# Patient Record
Sex: Female | Born: 1955 | Race: Black or African American | Hispanic: No | Smoking: Former smoker
Health system: Southern US, Community
[De-identification: ages and names within clinical notes are randomized; demographics above are authoritative.]

## PROBLEM LIST (undated history)

## (undated) DIAGNOSIS — Z8719 Personal history of other diseases of the digestive system: Secondary | ICD-10-CM

## (undated) DIAGNOSIS — D509 Iron deficiency anemia, unspecified: Secondary | ICD-10-CM

## (undated) DIAGNOSIS — D649 Anemia, unspecified: Secondary | ICD-10-CM

## (undated) DIAGNOSIS — E876 Hypokalemia: Secondary | ICD-10-CM

## (undated) DIAGNOSIS — J45909 Unspecified asthma, uncomplicated: Secondary | ICD-10-CM

## (undated) DIAGNOSIS — K760 Fatty (change of) liver, not elsewhere classified: Secondary | ICD-10-CM

## (undated) DIAGNOSIS — R1013 Epigastric pain: Secondary | ICD-10-CM

## (undated) DIAGNOSIS — K860 Alcohol-induced chronic pancreatitis: Secondary | ICD-10-CM

## (undated) DIAGNOSIS — I10 Essential (primary) hypertension: Secondary | ICD-10-CM

## (undated) HISTORY — DX: Alcohol-induced chronic pancreatitis: K86.0

## (undated) HISTORY — DX: Personal history of other diseases of the digestive system: Z87.19

## (undated) HISTORY — DX: Fatty (change of) liver, not elsewhere classified: K76.0

## (undated) HISTORY — PX: LAPAROSCOPIC OOPHERECTOMY: SHX6507

## (undated) HISTORY — DX: Hypokalemia: E87.6

## (undated) HISTORY — DX: Iron deficiency anemia, unspecified: D50.9

## (undated) HISTORY — DX: Epigastric pain: R10.13

---

## 2004-10-09 ENCOUNTER — Emergency Department: Payer: Self-pay | Admitting: Unknown Physician Specialty

## 2004-10-15 ENCOUNTER — Emergency Department: Payer: Self-pay | Admitting: Internal Medicine

## 2004-10-22 ENCOUNTER — Emergency Department: Payer: Self-pay | Admitting: General Practice

## 2005-11-03 ENCOUNTER — Emergency Department: Payer: Self-pay | Admitting: Emergency Medicine

## 2007-10-03 ENCOUNTER — Emergency Department: Payer: Self-pay | Admitting: Unknown Physician Specialty

## 2007-11-09 ENCOUNTER — Emergency Department: Payer: Self-pay | Admitting: Internal Medicine

## 2007-11-09 ENCOUNTER — Other Ambulatory Visit: Payer: Self-pay

## 2008-02-16 ENCOUNTER — Emergency Department: Payer: Self-pay | Admitting: Emergency Medicine

## 2008-02-28 ENCOUNTER — Emergency Department: Payer: Self-pay | Admitting: Emergency Medicine

## 2008-02-29 ENCOUNTER — Other Ambulatory Visit: Payer: Self-pay

## 2010-08-12 ENCOUNTER — Emergency Department: Payer: Self-pay | Admitting: Emergency Medicine

## 2010-11-30 ENCOUNTER — Emergency Department: Payer: Self-pay | Admitting: Emergency Medicine

## 2010-12-08 ENCOUNTER — Emergency Department: Payer: Self-pay | Admitting: Emergency Medicine

## 2010-12-24 ENCOUNTER — Inpatient Hospital Stay: Payer: Self-pay | Admitting: Internal Medicine

## 2010-12-24 DIAGNOSIS — I059 Rheumatic mitral valve disease, unspecified: Secondary | ICD-10-CM

## 2010-12-25 DIAGNOSIS — R0602 Shortness of breath: Secondary | ICD-10-CM

## 2010-12-26 ENCOUNTER — Ambulatory Visit: Payer: Self-pay | Admitting: Family Medicine

## 2011-06-15 HISTORY — PX: COLONOSCOPY: SHX174

## 2011-07-01 ENCOUNTER — Ambulatory Visit: Payer: Self-pay | Admitting: Internal Medicine

## 2011-07-09 ENCOUNTER — Ambulatory Visit: Payer: Self-pay | Admitting: Gastroenterology

## 2011-11-04 IMAGING — CT CT ABD-PELV W/ CM
1 of 2 series · 15 of 32 positions shown, 19 images · non-contrast
Comparison: none

REASON FOR EXAM: [id number]  RLQ LLQ Abd Pain Constipation Nausea
COMMENTS:

[Series 2: abd 3mm w 3.0 i40f 3 · axial · 0.96mm/px · z∈[-533,-158]mm · 15 of 137 slices shown, 19 images]
[im 6/137  soft-tissue]
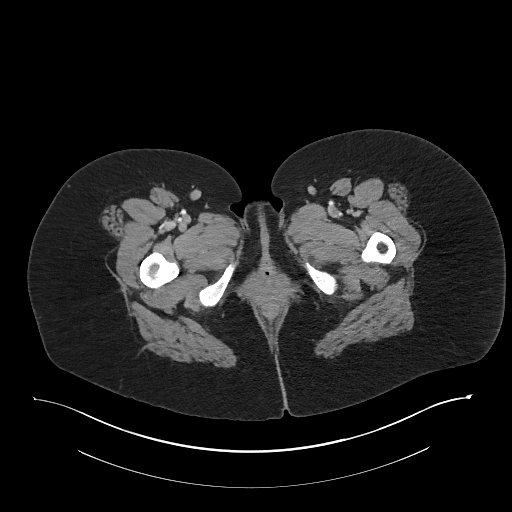
[im 6/137  bone]
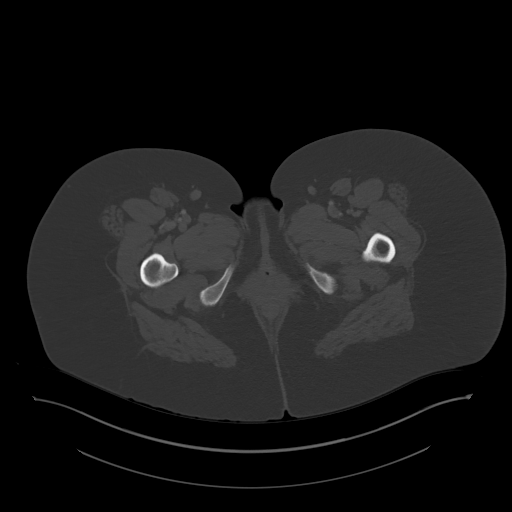
[im 16/137  soft-tissue]
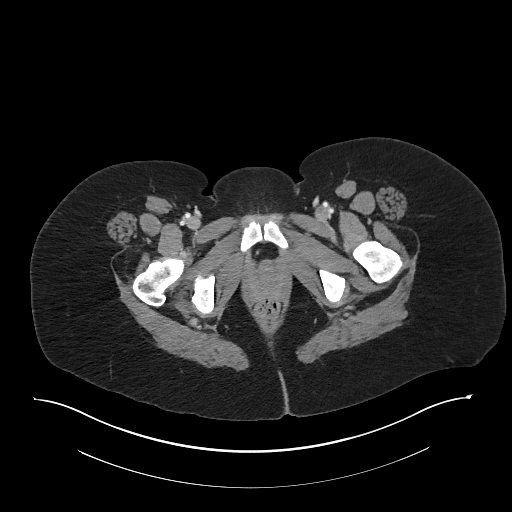
[im 27/137  soft-tissue]
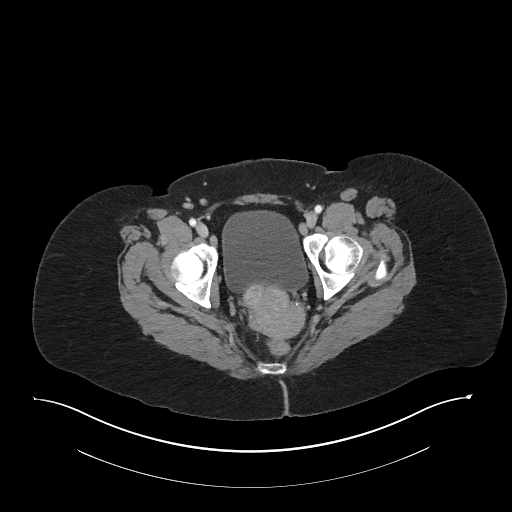
[im 37/137  soft-tissue]
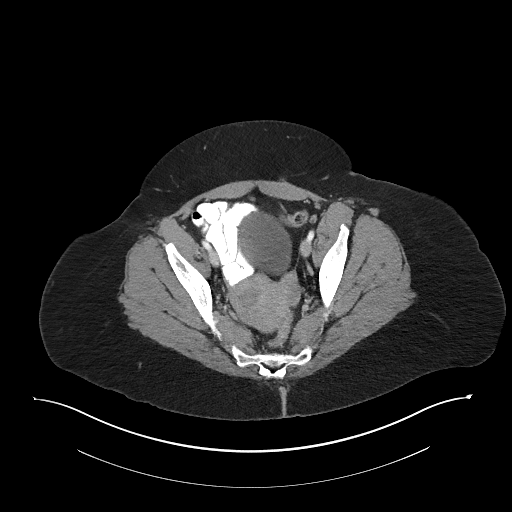
[im 48/137  soft-tissue]
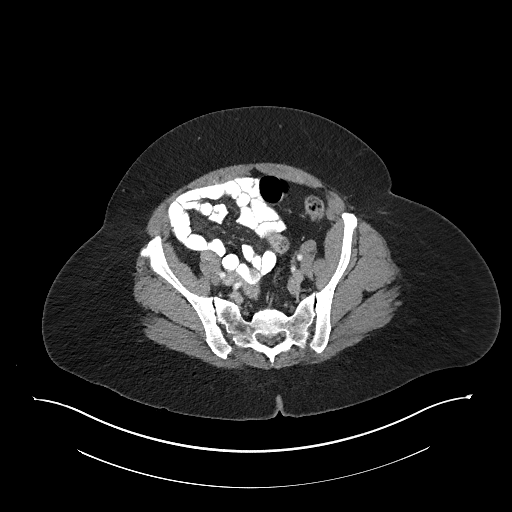
[im 58/137  soft-tissue]
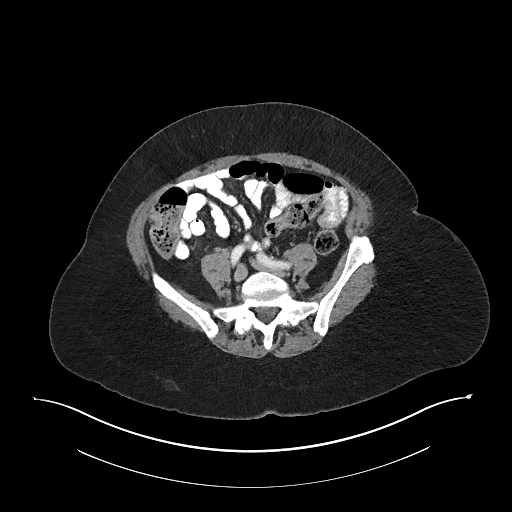
[im 69/137  soft-tissue]
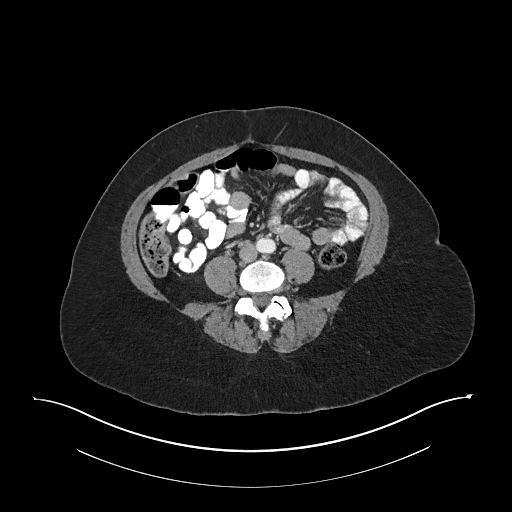
[im 79/137  soft-tissue]
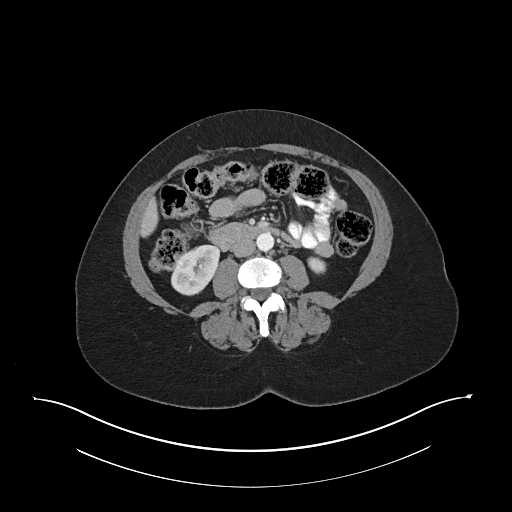
[im 89/137  soft-tissue]
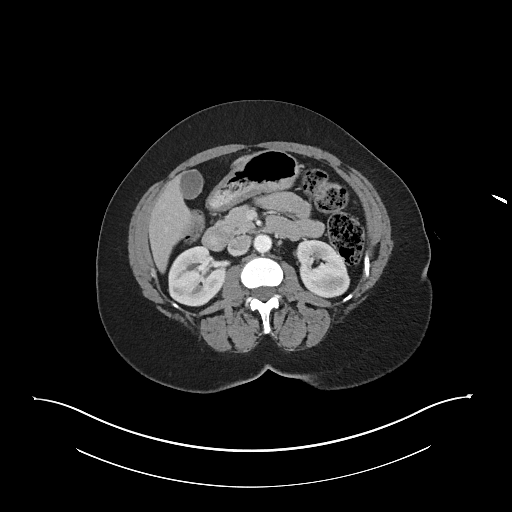
[im 89/137  bone]
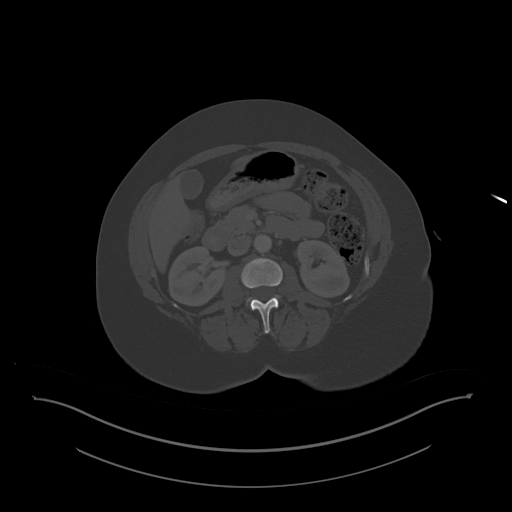
[im 100/137  soft-tissue]
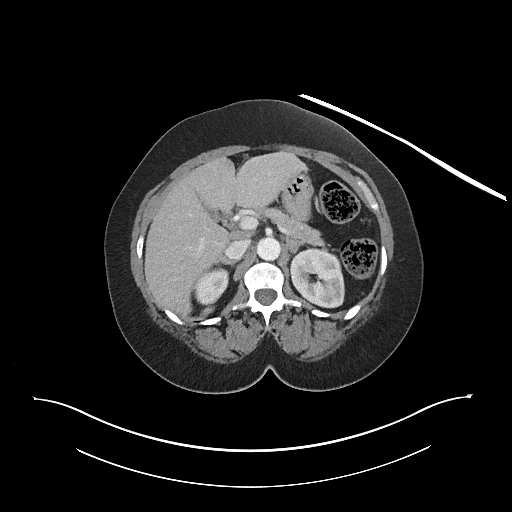
[im 110/137  soft-tissue]
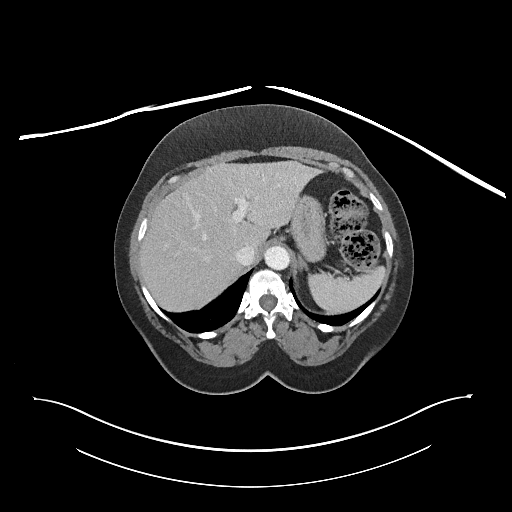
[im 116/137  lung]
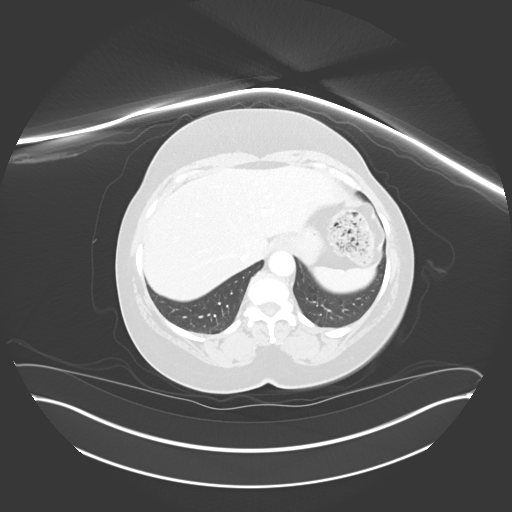
[im 121/137  soft-tissue]
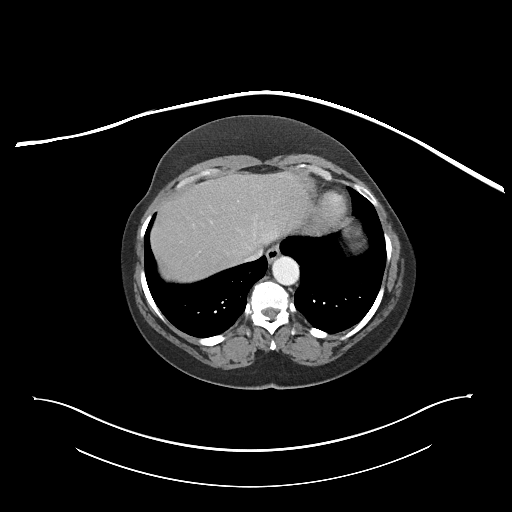
[im 121/137  lung]
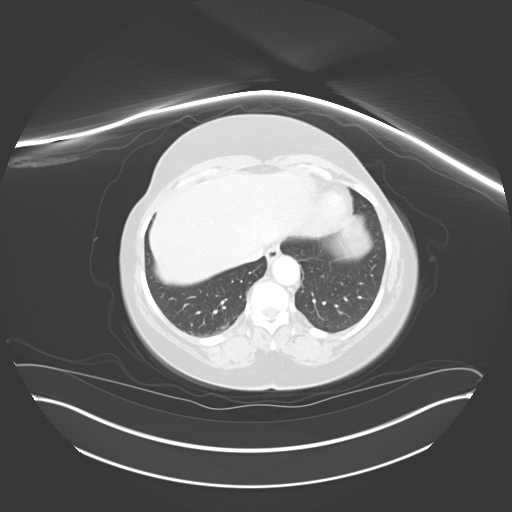
[im 126/137  lung]
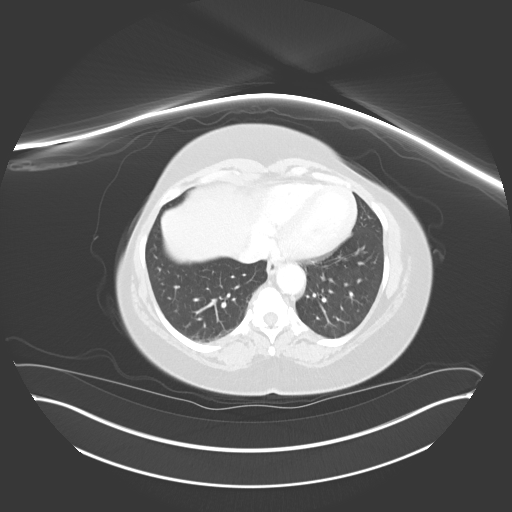
[im 131/137  soft-tissue]
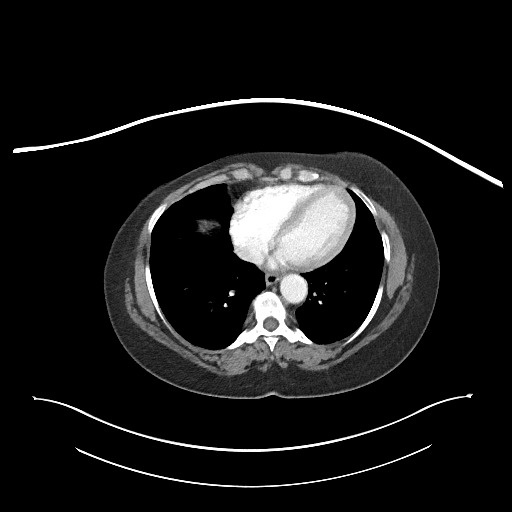
[im 131/137  lung]
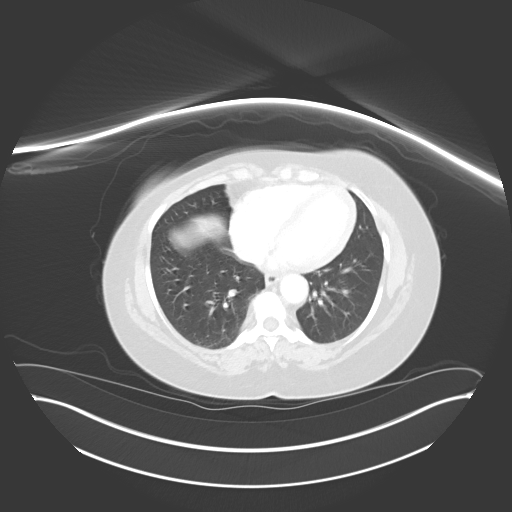

[15 of 32 positions shown; findings below may reference images not displayed]

PROCEDURE:     KCT - KCT ABDOMEN/PELVIS W  - July 01, 2011  [DATE]

RESULT:     CT of the abdomen and pelvis is performed with 100 mL of
Psovue-NEM iodinated intravenous contrast and oral contrast. Images are
reconstructed in the axial plane at 3.0 mm slice thickness. There is no
previous exam for comparison.

Images through the base the lungs demonstrate some minimal respiratory
motion artifact without infiltrate or mass.

The liver, spleen, pancreas, kidneys, aorta and adrenal glands appear
unremarkable. The gallbladder is present without evidence of stones. The
stomach is emptied of contrast and the contrast is passed through the
proximal small bowel but is not present to any degree in the colon at the
time of imaging. There is a moderate amount of fecal material scattered
through the colon raising the possibility of constipation. No abnormal bowel
distention is present to suggest obstruction. There is no evidence of
pneumoperitoneum. The abdominal wall appears intact. The appendix is normal.
There is no acute inflammatory process. The uterus shows a heterogeneous
pattern of enhancement which could raise the possibility of multiple
leiomyomatous changes. No calcification is present. The urinary bladder
appears unremarkable. There is no hydronephrosis or definite renal mass. No
calculi are evident.
IMPRESSION: 1. Possible mild constipation. Correlate clinically.
2. Heterogeneous appearance the uterus which raises the possibility of some
mild leiomyomatous disease. Correlate clinically. Ultrasounds available for
followup if desired based on clinical findings.

## 2012-01-16 ENCOUNTER — Ambulatory Visit: Payer: Self-pay | Admitting: Family Medicine

## 2012-05-15 ENCOUNTER — Inpatient Hospital Stay: Payer: Self-pay | Admitting: Internal Medicine

## 2012-05-15 LAB — CBC WITH DIFFERENTIAL/PLATELET
Basophil #: 0 10*3/uL (ref 0.0–0.1)
Basophil %: 0.4 %
Eosinophil %: 3 %
HCT: 37.7 % (ref 35.0–47.0)
HGB: 13.1 g/dL (ref 12.0–16.0)
Lymphocyte #: 0.6 10*3/uL — ABNORMAL LOW (ref 1.0–3.6)
Lymphocyte %: 8.5 %
MCH: 33.6 pg (ref 26.0–34.0)
Monocyte #: 0.2 x10 3/mm (ref 0.2–0.9)
Neutrophil #: 6.1 10*3/uL (ref 1.4–6.5)
Neutrophil %: 85.1 %
RBC: 3.89 10*6/uL (ref 3.80–5.20)

## 2012-05-15 LAB — BASIC METABOLIC PANEL
Anion Gap: 9 (ref 7–16)
BUN: 9 mg/dL (ref 7–18)
Chloride: 107 mmol/L (ref 98–107)
EGFR (African American): >60
EGFR (Non-African Amer.): >60
Glucose: 118 mg/dL — ABNORMAL HIGH (ref 65–99)
Osmolality: 281 (ref 275–301)
Potassium: 3.6 mmol/L (ref 3.5–5.1)
Sodium: 141 mmol/L (ref 136–145)

## 2012-06-17 ENCOUNTER — Observation Stay: Payer: Self-pay | Admitting: Specialist

## 2012-06-17 LAB — CBC
HGB: 11.3 g/dL — ABNORMAL LOW (ref 12.0–16.0)
MCH: 31.4 pg (ref 26.0–34.0)
MCV: 95 fL (ref 80–100)
Platelet: 404 10*3/uL (ref 150–440)
RBC: 3.59 10*6/uL — ABNORMAL LOW (ref 3.80–5.20)
RDW: 14.6 % — ABNORMAL HIGH (ref 11.5–14.5)
WBC: 6 10*3/uL (ref 3.6–11.0)

## 2012-06-17 LAB — BASIC METABOLIC PANEL
Calcium, Total: 9.8 mg/dL (ref 8.5–10.1)
EGFR (African American): >60
EGFR (Non-African Amer.): >60
Glucose: 111 mg/dL — ABNORMAL HIGH (ref 65–99)
Potassium: 4.1 mmol/L (ref 3.5–5.1)

## 2013-01-29 ENCOUNTER — Ambulatory Visit: Payer: Self-pay | Admitting: Family Medicine

## 2014-07-26 ENCOUNTER — Ambulatory Visit: Payer: Self-pay | Admitting: Family Medicine

## 2015-01-31 NOTE — Discharge Summary (Signed)
PATIENT NAME:  Linda Mcgee, STRAKA MR#:  [id number] DATE OF BIRTH:  1956/05/16  DATE OF ADMISSION:  05/15/2012 DATE OF DISCHARGE:  05/17/2012   PRIMARY CARE PHYSICIAN: [name], MD  DISCHARGE DIAGNOSES:  1. Acute respiratory failure. 2. Asthma exacerbation.  3. Hypertension.  CODE STATUS: FULL CODE.   HOME MEDICATIONS:  1. Advair 250 mcg/50 mcg inhaler, one puff twice a day.  2. Combivent 103 mcg/18 mcg inhalation two puffs every 4 hours p.r.n. wheezing.  3. Ventolin HFA one spray p.r.n.  CONTINUE HOME MEDICATIONS: 1. HCTZ 12.5 mg/10 mg p.o. daily.  2. Prednisone 40 mg p.o. daily, then taper.   ACTIVITY: As tolerated.   DIET: Low sodium diet.  DISCHARGE FOLLOWUP: Followup with PCP within 1 to 2 weeks.   REASON FOR ADMISSION: Respiratory distress.   HOSPITAL COURSE: The patient is a 59 year old African American female with a history of asthma and hypertension who presented to the ED with shortness of breath with just minimal activity, and wheezing. She came to the ED and was found to be in respiratory distress and was given multiple nebulizers and high dose of IV steroid with only mild improvement. She was able to barely speak in complete sentences and was admitted for respiratory distress and asthma exacerbation. For detailed history and physical examination, please refer to the admission note dictated by Dr. Edwina Barth. After admission the patient has been treated with O2 by nasal cannula, Solu-Medrol IV, frequent nebulizers, and Levaquin. After the above-mentioned treatment, the patient's symptoms have much improved. She only has mild wheezing, no shortness of breath, cough, or sputum, and vital signs are stable. On physical examination, she has only wheezing in bilateral lungs. The patient is clinically stable and will be discharged to home today.   I discussed the patient's discharge plan with the patient.   TIME SPENT: About 32 minutes.  ____________________________ Demetrios Loll, MD qc:slb D: 05/17/2012 14:16:41 ET T: 05/18/2012 10:30:28 ET JOB#: [id number]  cc: Demetrios Loll, MD, <Dictator> Irven Easterly. Kary Kos, MD Demetrios Loll MD ELECTRONICALLY SIGNED 05/19/2012 15:12

## 2015-01-31 NOTE — H&P (Signed)
PATIENT NAME:  Linda Mcgee, Linda Mcgee MR#:  [id number] DATE OF BIRTH:  [birth year]  DATE OF ADMISSION:  05/15/2012  PRIMARY CARE PHYSICIAN: [name], MD   PULMONOLOGIST: Wallene Huh, MD   CHIEF COMPLAINT: Respiratory distress.   HISTORY OF PRESENT ILLNESS: This is a 59 year old female who has a history of asthma. She said about four weeks ago she was having exacerbation, was seen as an outpatient, given a steroid taper, felt a Silverio better, then worse when she was off of steroids. She went to Urgent Care and was given a Z-Pak. Since completing that she didn't feel any better. She's been having cough that's fairly nonproductive and wheezing a lot, getting short of breath with just minimal activity. Today on her way to work she just said she could not breathe, came to the hospital and found to be in respiratory distress, given multiple nebulizers and high dose IV steroids with only mild improvement. She is able to barely speak in complete sentences but if she tries to ambulate more than a few steps she is severely short of breath and she is still wheezing so we're going to go ahead and admit her for further aggressive treatment.   PAST MEDICAL HISTORY:  1. Asthma.  2. Hypertension.   ALLERGIES: No known drug allergies.   CURRENT MEDICATIONS:  1. Advair 250/50 1 puff b.i.d.  2. Ventolin 1 puff q.4 hours p.r.n.  3. Albuterol nebs p.r.n.  4. Lisinopril/hydrochlorothiazide 10/12.5 mg daily.   SOCIAL HISTORY: Does not smoke. Does not drink any alcohol.   FAMILY HISTORY: Significant for hypertension.   REVIEW OF SYSTEMS: CONSTITUTIONAL: No fever. EYES: No blurred vision. ENT: No hearing loss. CARDIOVASCULAR: No chest pain. PULMONARY: She has shortness of breath and wheezing. GI: No nausea, vomiting, or diarrhea. GU: No dysuria. ENDOCRINE: No heat or cold intolerance. INTEGUMENTARY: No rash. MUSCULOSKELETAL: Occasional joint pain. NEUROLOGIC: No numbness or weakness.   PHYSICAL EXAMINATION:    VITAL SIGNS: Temperature 97.3, pulse 80, respirations 20, blood pressure 193/105.   GENERAL: This is a well nourished black female in mild respiratory distress.   HEENT: The pupils are equal, round, and reactive to light. Sclerae nonicteric. Oral mucosa is moist. Oropharynx is clear. Nasopharynx is clear.   NECK: Supple. No JVD, lymphadenopathy, or thyromegaly.   CARDIOVASCULAR: Regular rate and rhythm. No murmurs.   LUNGS: There is expiratory and inspiratory wheezing. No dullness to percussion. She is mildly using accessory muscles.   ABDOMEN: Soft, nontender, nondistended. Bowel sounds are positive. No hepatosplenomegaly. No masses.   EXTREMITIES: There is no edema. No joint deformity.   NEUROLOGIC: Cranial nerves II through XII are intact. She is alert and oriented x4.   SKIN: Moist with no rash.   RADIOLOGICAL DATA: Chest x-ray shows no infiltrates.   ASSESSMENT AND PLAN:  1. Respiratory distress. She has been in mild to moderate respiratory distress with her asthma exacerbation. Will give her some low flow oxygen and treat aggressively the underlying cause. Monitor for any deterioration.  2. Asthma exacerbation. I am going to give her high dose IV steroids and frequent nebulizers and monitor.  3. Hypertension. Will continue her lisinopril/hydrochlorothiazide. It is probably exacerbated now because of her respiratory distress. Will adjust the medications as necessary.   TIME SPENT ON ADMISSION: 45 minutes.   ____________________________ Baxter Hire, MD jdj:drc D: 05/15/2012 14:27:46 ET T: 05/15/2012 14:34:40 ET JOB#: [id number]  cc: Baxter Hire, MD, <Dictator> Irven Easterly. Kary Kos, MD Herbon E. Raul Del, MD Baxter Hire  MD ELECTRONICALLY SIGNED 05/15/2012 21:09

## 2015-01-31 NOTE — Discharge Summary (Signed)
PATIENT NAME:  Linda Mcgee, STRANG MR#:  [id number] DATE OF BIRTH:  11/01/1955  DATE OF ADMISSION:  06/17/2012 DATE OF DISCHARGE:  06/18/2012  For a detailed note, please take a look at the history and physical done on admission.   DIAGNOSES AT DISCHARGE:  1. Acute asthma exacerbation.  2. History of hypertension.   DIET: The patient is being discharged on a low sodium diet.   ACTIVITY: As tolerated.   FOLLOW-UP: Follow-up in the next 1 to 2 weeks with Dr. Raul Del and also Dr. Kary Kos.   DISCHARGE MEDICATIONS:  1. Hydrochlorothiazide/lisinopril 12.5/10 one tab daily.  2. Advair 250/50 1 puff b.i.d.  3. Combivent 2 puffs q.4 hours as needed.  4. Albuterol inhaler 2 puffs b.i.d. as needed.  5. Zyrtec 10 mg b.i.d. as needed.  6. Albuterol nebulizers q.i.d. as needed.  7. Singular 10 mg daily.  8. Prednisone taper starting at 60 mg, down to 10 mg over the next 12 days.   BRIEF HOSPITAL COURSE: This is a 59 year old female with medical problems as mentioned above who presented to the hospital secondary to shortness of breath and respiratory failure from asthma exacerbation.  1. Asthma exacerbation. The likely cause of the patient's asthma exacerbation is allergic rhinitis. The patient is supposed to be seeing an allergist as an outpatient coming up fairly soon. The patient follows up with Dr. Raul Del who had referred her to an allergist. In the hospital the patient was started on IV steroids and around-the-clock nebulizer treatments. I did add some Singulair on top of her maintenance therapy. She, therefore, is being presently discharged on a long prednisone taper along with her Advair, Combivent, and albuterol rescue inhaler with nebulizer treatments along with Singulair. She will have close follow-up with Dr. Raul Del as an outpatient and will see the allergist once she finishes her prednisone taper as stated. Clinically she is hemodynamically stable and her shortness of breath has improved.   2. Hypertension. She remained hemodynamically stable on her Zestoretic and she will resume that upon discharge.   CODE STATUS: The patient is a FULL CODE.   TIME SPENT: 35 minutes.   ____________________________ Belia Heman. Verdell Carmine, MD vjs:drc D: 06/18/2012 17:01:19 ET T: 06/19/2012 14:02:24 ET JOB#: [id number]  cc: Belia Heman. Verdell Carmine, MD, <Dictator> Herbon E. Raul Del, MD Irven Easterly. Kary Kos, MD Henreitta Leber MD ELECTRONICALLY SIGNED 06/22/2012 13:24

## 2015-01-31 NOTE — H&P (Signed)
PATIENT NAME:  Linda Mcgee, Linda Mcgee MR#:  [id number] DATE OF BIRTH:  12-Jul-1956  DATE OF ADMISSION:  06/17/2012  PRIMARY CARE PHYSICIAN: [name], MD   CHIEF COMPLAINT: Shortness of breath.   HISTORY OF PRESENT ILLNESS: This is a 59 year old female who presents to the Emergency Room due to progressive shortness of breath over the past couple of days. The patient says that she has a history of asthma, was here in the hospital about 4 to 5 weeks ago with asthma exacerbation, and had been doing well since she went home until the past couple of days her shortness of breath has been getting worse on exertion. This morning it was significantly worse where even while trying to change her clothes or going to the bathroom she was significantly short of breath and, therefore, came to the ER for further evaluation. In the Emergency Room, the patient received three nebulizer treatments along with IV steroids with Ariel improvement in her symptoms. Hospitalist service was contacted for further treatment and evaluation. The patient does admit to a cough productive of clear sputum, some postnasal drip but no nausea, no vomiting, no chest pain, and no other associated symptoms presently.   REVIEW OF SYSTEMS: CONSTITUTIONAL: No documented fever. No weight gain, no weight loss. EYES: No blurry or double vision. ENT: No tinnitus. Positive postnasal drip. No redness of the oropharynx. RESPIRATORY: Positive cough. Positive wheeze. No hemoptysis. Positive dyspnea on exertion. CARDIOVASCULAR: No chest pain, no orthopnea, no palpitations, no syncope. GI: No nausea, no vomiting, no diarrhea, no abdominal pain, no melena, no hematochezia. GU: No dysuria, no hematuria. ENDOCRINE: No polyuria or nocturia. No heat or cold intolerance. HEME: No anemia, no bruising, no bleeding. INTEGUMENTARY: No rashes, no lesions. MUSCULOSKELETAL: No arthritis, no swelling, no gout. NEUROLOGIC: No numbness, no tingling, no ataxia, no seizure-type  activity. PSYCH: No anxiety, no insomnia, no ADD.   PAST MEDICAL HISTORY:  1. Asthma. 2. Hypertension.   ALLERGIES: No known drug allergies.   SOCIAL HISTORY: No smoking. Occasional alcohol use. No illicit drug abuse. Lives at home by herself.   FAMILY HISTORY: Significant for asthma and hypertension.   CURRENT MEDICATIONS:  1. Advair 250/50 1 puff daily.  2. Albuterol nebulizer q.4 hours.  3. Combivent 2 puffs q.4 hours as needed.  4. Hydrochlorothiazide/lisinopril 12.5/10 one tab daily.  5. Albuterol inhaler 2 puffs b.i.d. as needed.  6. Zyrtec 10 mg b.i.d. as needed.   PHYSICAL EXAMINATION ON ADMISSION:   VITAL SIGNS: Temperature 98, pulse 81, respirations 20, blood pressure 173/96, sats 98% on 2 liters nasal cannula.    GENERAL: She is a pleasant appearing female in mild respiratory distress.   HEENT: Atraumatic, normocephalic. Extraocular muscles are intact. Pupils equal and reactive to light. Sclerae anicteric. No conjunctival injection. No pharyngeal erythema.   NECK: Supple. No jugular venous distention. No bruits, no lymphadenopathy, no thyromegaly.   HEART: Tachycardic, regular. No murmurs, no rubs, no clicks.   LUNGS: She has some coarse rhonchi and wheezing diffusely. Negative use of accessory muscles. No dullness to percussion.   ABDOMEN: Soft, flat, nontender, nondistended. Has good bowel sounds. No hepatosplenomegaly appreciated.   EXTREMITIES: No evidence of any cyanosis, clubbing, or peripheral edema. Has +2 pedal and radial pulses bilaterally.   NEUROLOGICAL: The patient is alert, awake, and oriented x3 with no focal motor or sensory deficits appreciated bilaterally.   SKIN: Moist and warm with no rash appreciated.   LYMPHATIC: There is no cervical or axillary lymphadenopathy.   LABORATORY,  DIAGNOSTIC, AND RADIOLOGICAL DATA: Serum glucose 111, BUN 16, creatinine 0.8, sodium 143, potassium 4.1, chloride 112, bicarb 25. The patient's white cell count is 6,  hemoglobin 11.3, hematocrit 34, platelet count 404.   The patient did have a chest x-ray done which showed no evidence of acute cardiopulmonary disease. Mild hyperinflation consistent with reactive airway disease.   ASSESSMENT AND PLAN: This is a 59 year old female with a history of asthma and hypertension who presents to the hospital with shortness of breath, wheezing, and likely an asthma exacerbation.  1. Asthma exacerbation. The patient probably has mild to moderate intermittent asthma. The current exacerbation is possibly related to bronchitis/allergic rhinitis. Her chest x-ray does not show any evidence of acute pneumonia. She is afebrile. She has normal white cell count. I will start treatment with IV steroids, around-the-clock nebulizer treatments. Continue her Advair. I will also add some Singulair for an allergic component to see if it helps improve her symptoms and improve relapses. The patient has been followed by Dr. Raul Del from Pulmonary. If needed, will consult him and follow her clinically.  2. Hypertension, presently hemodynamically stable. Continue Zestoretic as stated.   CODE STATUS: The patient is a FULL CODE.   TIME SPENT: 45 minutes.   ____________________________ Belia Heman. Verdell Carmine, MD vjs:drc D: 06/17/2012 10:54:41 ET T: 06/17/2012 11:16:16 ET JOB#: [id number]  cc: Belia Heman. Verdell Carmine, MD, <Dictator> Irven Easterly. Kary Kos, MD Henreitta Leber MD ELECTRONICALLY SIGNED 06/17/2012 15:57

## 2020-05-10 ENCOUNTER — Emergency Department
Admission: EM | Admit: 2020-05-10 | Discharge: 2020-05-11 | Disposition: A | Discharge status: Home / Self Care | Payer: Self-pay | Attending: Emergency Medicine | Admitting: Emergency Medicine

## 2020-05-10 ENCOUNTER — Other Ambulatory Visit: Payer: Self-pay

## 2020-05-10 ENCOUNTER — Encounter: Payer: Self-pay | Admitting: Emergency Medicine

## 2020-05-10 DIAGNOSIS — K922 Gastrointestinal hemorrhage, unspecified: Secondary | ICD-10-CM | POA: Insufficient documentation

## 2020-05-10 DIAGNOSIS — D649 Anemia, unspecified: Secondary | ICD-10-CM | POA: Insufficient documentation

## 2020-05-10 DIAGNOSIS — F17200 Nicotine dependence, unspecified, uncomplicated: Secondary | ICD-10-CM | POA: Insufficient documentation

## 2020-05-10 DIAGNOSIS — I10 Essential (primary) hypertension: Secondary | ICD-10-CM | POA: Insufficient documentation

## 2020-05-10 DIAGNOSIS — J45909 Unspecified asthma, uncomplicated: Secondary | ICD-10-CM | POA: Insufficient documentation

## 2020-05-10 HISTORY — DX: Unspecified asthma, uncomplicated: J45.909

## 2020-05-10 HISTORY — DX: Essential (primary) hypertension: I10

## 2020-05-10 HISTORY — DX: Anemia, unspecified: D64.9

## 2020-05-10 LAB — CBC
HCT: 24.9 % — ABNORMAL LOW (ref 36.0–46.0)
Hemoglobin: 7.4 g/dL — ABNORMAL LOW (ref 12.0–15.0)
MCH: 26.8 pg (ref 26.0–34.0)
MCHC: 29.7 g/dL — ABNORMAL LOW (ref 30.0–36.0)
MCV: 90.2 fL (ref 80.0–100.0)
Platelets: 476 K/uL — ABNORMAL HIGH (ref 150–400)
RBC: 2.76 MIL/uL — ABNORMAL LOW (ref 3.87–5.11)
RDW: 23.3 % — ABNORMAL HIGH (ref 11.5–15.5)
WBC: 9.7 K/uL (ref 4.0–10.5)
nRBC: 0.2 % (ref 0.0–0.2)

## 2020-05-10 LAB — COMPREHENSIVE METABOLIC PANEL WITH GFR
ALT: 39 U/L (ref 0–44)
AST: 33 U/L (ref 15–41)
Albumin: 3.7 g/dL (ref 3.5–5.0)
Alkaline Phosphatase: 54 U/L (ref 38–126)
Anion gap: 12 (ref 5–15)
BUN: 13 mg/dL (ref 8–23)
CO2: 26 mmol/L (ref 22–32)
Calcium: 10.4 mg/dL — ABNORMAL HIGH (ref 8.9–10.3)
Chloride: 100 mmol/L (ref 98–111)
Creatinine, Ser: 1.01 mg/dL — ABNORMAL HIGH (ref 0.44–1.00)
GFR calc Af Amer: >60 mL/min
GFR calc non Af Amer: 59 mL/min — ABNORMAL LOW
Glucose, Bld: 100 mg/dL — ABNORMAL HIGH (ref 70–99)
Potassium: 2.8 mmol/L — ABNORMAL LOW (ref 3.5–5.1)
Sodium: 138 mmol/L (ref 135–145)
Total Bilirubin: 0.9 mg/dL (ref 0.3–1.2)
Total Protein: 7.1 g/dL (ref 6.5–8.1)

## 2020-05-10 NOTE — ED Triage Notes (Signed)
Seen by PCP and had blood work drawn today. States HGB 7.5.  Referred to ED for transfusion.

## 2020-05-11 LAB — CBC WITH DIFFERENTIAL/PLATELET
Abs Immature Granulocytes: 0.04 10*3/uL (ref 0.00–0.07)
Basophils Absolute: 0 10*3/uL (ref 0.0–0.1)
Basophils Relative: 0 %
Eosinophils Absolute: 0.7 10*3/uL — ABNORMAL HIGH (ref 0.0–0.5)
Eosinophils Relative: 7 %
HCT: 25.9 % — ABNORMAL LOW (ref 36.0–46.0)
Hemoglobin: 7.6 g/dL — ABNORMAL LOW (ref 12.0–15.0)
Immature Granulocytes: 0 %
Lymphocytes Relative: 9 %
Lymphs Abs: 0.9 10*3/uL (ref 0.7–4.0)
MCH: 26.9 pg (ref 26.0–34.0)
MCHC: 29.3 g/dL — ABNORMAL LOW (ref 30.0–36.0)
MCV: 91.5 fL (ref 80.0–100.0)
Monocytes Absolute: 1.2 10*3/uL — ABNORMAL HIGH (ref 0.1–1.0)
Monocytes Relative: 12 %
Neutro Abs: 7.4 10*3/uL (ref 1.7–7.7)
Neutrophils Relative %: 72 %
Platelets: 503 10*3/uL — ABNORMAL HIGH (ref 150–400)
RBC: 2.83 MIL/uL — ABNORMAL LOW (ref 3.87–5.11)
RDW: 23.5 % — ABNORMAL HIGH (ref 11.5–15.5)
WBC: 10.2 10*3/uL (ref 4.0–10.5)
nRBC: 0.2 % (ref 0.0–0.2)

## 2020-05-11 LAB — CBC
HCT: 25.3 % — ABNORMAL LOW (ref 36.0–46.0)
Hemoglobin: 7.4 g/dL — ABNORMAL LOW (ref 12.0–15.0)
MCH: 27.2 pg (ref 26.0–34.0)
MCHC: 29.2 g/dL — ABNORMAL LOW (ref 30.0–36.0)
MCV: 93 fL (ref 80.0–100.0)
Platelets: 502 10*3/uL — ABNORMAL HIGH (ref 150–400)
RBC: 2.72 MIL/uL — ABNORMAL LOW (ref 3.87–5.11)
RDW: 23.8 % — ABNORMAL HIGH (ref 11.5–15.5)
WBC: 9.9 10*3/uL (ref 4.0–10.5)
nRBC: 0.2 % (ref 0.0–0.2)

## 2020-05-11 LAB — IRON AND TIBC
Iron: 14 ug/dL — ABNORMAL LOW (ref 28–170)
Saturation Ratios: 4 % — ABNORMAL LOW (ref 10.4–31.8)
TIBC: 382 ug/dL (ref 250–450)
UIBC: 368 ug/dL

## 2020-05-11 LAB — RETICULOCYTES
Immature Retic Fract: 32.4 % — ABNORMAL HIGH (ref 2.3–15.9)
Immature Retic Fract: 33.3 % — ABNORMAL HIGH (ref 2.3–15.9)
RBC.: 2.72 MIL/uL — ABNORMAL LOW (ref 3.87–5.11)
RBC.: 2.83 MIL/uL — ABNORMAL LOW (ref 3.87–5.11)
Retic Count, Absolute: 76.1 10*3/uL (ref 19.0–186.0)
Retic Count, Absolute: 77 10*3/uL (ref 19.0–186.0)
Retic Ct Pct: 2.7 % (ref 0.4–3.1)
Retic Ct Pct: 2.8 % (ref 0.4–3.1)

## 2020-05-11 LAB — FOLATE: Folate: 4.8 ng/mL — ABNORMAL LOW (ref >5.9)

## 2020-05-11 LAB — FERRITIN: Ferritin: 23 ng/mL (ref 11–307)

## 2020-05-11 LAB — VITAMIN B12: Vitamin B-12: 5108 pg/mL — ABNORMAL HIGH (ref 180–914)

## 2020-05-11 LAB — HEMOGLOBIN AND HEMATOCRIT, BLOOD
HCT: 28 % — ABNORMAL LOW (ref 36.0–46.0)
Hemoglobin: 8.7 g/dL — ABNORMAL LOW (ref 12.0–15.0)

## 2020-05-11 LAB — PREPARE RBC (CROSSMATCH): Order Confirmation: POSITIVE

## 2020-05-11 MED ORDER — SODIUM CHLORIDE 0.9 % IV SOLN: 10.0000 mL/h | Freq: Once | INTRAVENOUS | Status: DC

## 2020-05-11 NOTE — ED Provider Notes (Signed)
[facility] Emergency Department Provider Note  ____________________________________________   First MD Initiated Contact with Patient 05/10/20 2344     (approximate)  I have reviewed the triage vital signs and the nursing notes.   HISTORY  Chief Complaint No chief complaint on file.    HPI Linda Mcgee is a 64 y.o. female with medical history as listed below who presents at the recommendation of her primary care provider for further evaluation of low hemoglobin.  The patient reports that for a couple of months she has had a very gradual and slow increase in fatigue, generalized weakness, and some dyspnea on exertion.  She said that she is to the point that walking up the stairs makes her tired and feel like she needs to rest even though it is not necessarily an inability to catch her breath.  She just feels very tired.  She has not noticed any blood in her stool or dark-colored stool.  She has had some aching abdominal pain in the upper part of her abdomen that is often worse after she eats.  She still has a good appetite and has had no nausea nor vomiting.  Over the last couple of days she has had some lower abdominal aching as well but it is currently absent.  She has no blood in her urine and no dysuria or increased urinary frequency.  She had similar symptoms about 3 years ago when she lived in [place]. She said she had a work-up at that time and had no evidence of GI bleeding and no particular reason for her anemia was found.  She got better after transfusion.  She went to her primary care provider yesterday and they did some basic lab work.  She was called this evening and told to come to the emergency department for hemoglobin of 7.5.  She describes her symptoms as moderate in severity, very gradual in onset, nothing in particular makes it better and exertion seems to make her fatigue worse.  Eating seems to make the upper abdominal discomfort  worse.  Of note, she takes quite a bit of NSAIDs (Aleve) and she drinks an estimated 3 bottles of wine over each weekend (a bottle each on Friday, Saturday, and Sunday night).  She says that she does not drink any during the week.        Past Medical History:  Diagnosis Date  . Anemia   . Asthma   . Hypertension     There are no problems to display for this patient.   Past Surgical History:  Procedure Laterality Date  . LAPAROSCOPIC OOPHERECTOMY Left     Prior to Admission medications   Not on File    Allergies Patient has no known allergies.  No family history on file.  Social History Social History   Tobacco Use  . Smoking status: Current Some Day Smoker  . Smokeless tobacco: Never Used  Substance Use Topics  . Alcohol use: Not on file  . Drug use: Not on file    Review of Systems Constitutional: No fever/chills.  Fatigue. Eyes: No visual changes. ENT: No sore throat. Cardiovascular: Denies chest pain. Respiratory: Denies shortness of breath except sometimes with exertion. Gastrointestinal: Upper abdominal aching pain, usually worse after eating.  Denies nausea, vomiting, diarrhea.  Denies black and tarry stool. Genitourinary: Negative for dysuria. Musculoskeletal: Negative for neck pain.  Negative for back pain. Integumentary: Negative for rash. Neurological: Negative for headaches, focal weakness or numbness.   ____________________________________________  PHYSICAL EXAM:  VITAL SIGNS: ED Triage Vitals  Enc Vitals Group     BP 05/10/20 1627 (!) 145/77     Pulse Rate 05/10/20 1627 82     Resp 05/10/20 1627 16     Temp 05/10/20 1627 98.1 F (36.7 C)     Temp Source 05/10/20 1627 Oral     SpO2 05/10/20 1627 100 %     Weight 05/10/20 1628 68.9 kg (152 lb)     Height 05/10/20 1628 1.524 m (5')     Head Circumference --      Peak Flow --      Pain Score 05/10/20 1628 0     Pain Loc --      Pain Edu? --      Excl. in [place]? --      Constitutional: Alert and oriented.  Generally well-appearing and in no distress. Eyes: Conjunctivae are normal.  Head: Atraumatic. Nose: No congestion/rhinnorhea. Mouth/Throat: Patient is wearing a mask. Neck: No stridor.  No meningeal signs.   Cardiovascular: Normal rate, regular rhythm. Good peripheral circulation. Grossly normal heart sounds. Respiratory: Normal respiratory effort.  No retractions. Gastrointestinal: Soft and nontender in any quadrant at this time including no epigastric tenderness and no right upper quadrant tenderness.  Negative Murphy sign.  No lower abdominal tenderness, no rebound, no guarding. Rectal: Normal external rectal exam with no evidence of external hemorrhoids.  Nontender digital exam.  Brown stool, no gross blood but Hemoccult positive.  ED chaperone present throughout exam. Musculoskeletal: No lower extremity tenderness nor edema. No gross deformities of extremities. Neurologic:  Normal speech and language. No gross focal neurologic deficits are appreciated.  Skin:  Skin is warm, dry and intact. Psychiatric: Mood and affect are normal. Speech and behavior are normal.  ____________________________________________   LABS (all labs ordered are listed, but only abnormal results are displayed)  Labs Reviewed  COMPREHENSIVE METABOLIC PANEL - Abnormal; Notable for the following components:      Result Value   Potassium 2.8 (*)    Glucose, Bld 100 (*)    Creatinine, Ser 1.01 (*)    Calcium 10.4 (*)    GFR calc non Af Amer 59 (*)    All other components within normal limits  CBC - Abnormal; Notable for the following components:   RBC 2.76 (*)    Hemoglobin 7.4 (*)    HCT 24.9 (*)    MCHC 29.7 (*)    RDW 23.3 (*)    Platelets 476 (*)    All other components within normal limits  FOLATE - Abnormal; Notable for the following components:   Folate 4.8 (*)    All other components within normal limits  IRON AND TIBC - Abnormal; Notable for the  following components:   Iron 14 (*)    Saturation Ratios 4 (*)    All other components within normal limits  CBC - Abnormal; Notable for the following components:   RBC 2.72 (*)    Hemoglobin 7.4 (*)    HCT 25.3 (*)    MCHC 29.2 (*)    RDW 23.8 (*)    Platelets 502 (*)    All other components within normal limits  RETICULOCYTES - Abnormal; Notable for the following components:   RBC. 2.72 (*)    Immature Retic Fract 33.3 (*)    All other components within normal limits  CBC WITH DIFFERENTIAL/PLATELET - Abnormal; Notable for the following components:   RBC 2.83 (*)    Hemoglobin 7.6 (*)  HCT 25.9 (*)    MCHC 29.3 (*)    RDW 23.5 (*)    Platelets 503 (*)    Monocytes Absolute 1.2 (*)    Eosinophils Absolute 0.7 (*)    All other components within normal limits  RETICULOCYTES - Abnormal; Notable for the following components:   RBC. 2.83 (*)    Immature Retic Fract 32.4 (*)    All other components within normal limits  FERRITIN  VITAMIN B12  HEMOGLOBIN AND HEMATOCRIT, BLOOD  TYPE AND SCREEN  ABO/RH  PREPARE RBC (CROSSMATCH)   ____________________________________________  EKG  No indication for emergent EKG ____________________________________________  RADIOLOGY I, Hinda Kehr, personally viewed and evaluated these images (plain radiographs) as part of my medical decision making, as well as reviewing the written report by the radiologist.  ED MD interpretation: No indication for emergent imaging  Official radiology report(s): No results found.  ____________________________________________   PROCEDURES   Procedure(s) performed (including Critical Care):  .Critical Care Performed by: Hinda Kehr, MD Authorized by: Hinda Kehr, MD   Critical care provider statement:    Critical care time (minutes):  30   Critical care time was exclusive of:  Separately billable procedures and treating other patients   Critical care was necessary to treat or prevent  imminent or life-threatening deterioration of the following conditions: symptomatic anemia (Hb < 8) requiring PRBC transfusion.   Critical care was time spent personally by me on the following activities:  Development of treatment plan with patient or surrogate, discussions with consultants, evaluation of patient's response to treatment, examination of patient, obtaining history from patient or surrogate, ordering and performing treatments and interventions, ordering and review of laboratory studies, ordering and review of radiographic studies, pulse oximetry, re-evaluation of patient's condition and review of old charts     ____________________________________________   [place] / MDM / [place] / ED COURSE  As part of my medical decision making, I reviewed the following data within the [facility] notes reviewed and incorporated, Labs reviewed , Old chart reviewed and Notes from prior ED visits   Differential diagnosis includes, but is not limited to, iron deficiency anemia, GI bleeding from any of a number of sources (ulcer, AV malformation, diverticulosis, hemorrhoids, neoplasm), alcohol associated anemia, overuse of NSAIDs.  The patient's hemoglobin is 7.5 and she is symptomatic.  I believe she would benefit from a unit of PRBCs.  She is Hemoccult positive which is the likely source of her slow GI bleeding and given the upper abdominal discomfort I suspect she may have a least a degree of ulcerative disease.  However she has stable vital signs and is generally well-appearing with no tenderness to palpation of her abdomen.  I do not believe that she would benefit from emergent imaging tonight in the emergency department.  I sent an anemia panel prior to transfusion, conducted my usual and customary risk-benefit discussion about transfusions to obtain consent, and we will transfer her unit.  I recommended that she avoid NSAIDs (which had already been  recommended by her PCP) and that she start taking a PPI.  I provided follow-up information with gastroenterology as well as hematology for her.  She understands and agrees with the plan.       Clinical Course as of May 11 422  Thu May 11, 2020  0420 Of note, I will send a repeat H&H after blood transfusion is complete as a baseline for future evaluation and work-up.   [CF]  Clinical Course User Index [CF] Hinda Kehr, MD     ____________________________________________  FINAL CLINICAL IMPRESSION(S) / ED DIAGNOSES  Final diagnoses:  Symptomatic anemia  Gastrointestinal hemorrhage, unspecified gastrointestinal hemorrhage type     MEDICATIONS GIVEN DURING THIS VISIT:  Medications  0.9 %  sodium chloride infusion (has no administration in time range)     ED Discharge Orders    None      *Please note:  Sloka Volante Tunney was evaluated in Emergency Department on 05/11/2020 for the symptoms described in the history of present illness. She was evaluated in the context of the global COVID-19 pandemic, which necessitated consideration that the patient might be at risk for infection with the SARS-CoV-2 virus that causes COVID-19. Institutional protocols and algorithms that pertain to the evaluation of patients at risk for COVID-19 are in a state of rapid change based on information released by regulatory bodies including the CDC and federal and state organizations. These policies and algorithms were followed during the patient's care in the ED.  Some ED evaluations and interventions may be delayed as a result of limited staffing during and after the pandemic.*  Note:  This document was prepared using Dragon voice recognition software and may include unintentional dictation errors.   Hinda Kehr, MD 05/11/20 [id number]

## 2020-05-11 NOTE — Discharge Instructions (Signed)
As we discussed, you have some mild most likely chronic GI bleeding that has likely led to your hemoglobin being low.  There is no need to keep you in the hospital, but I recommend that you follow-up with a GI doctor to discuss additional evaluation of the exact source of your bleeding.  You may also want to follow-up with a hematologist to talk about your recurrent issues with anemia (although your anemia this time is most likely due to the GI bleed).  I provided phone numbers for both types of specialists and you can call and schedule an appointment, letting them know you are calling to schedule a follow-up appointment from an emergency department visit.  I recommend you take an over-the-counter iron supplement.  Avoid alcohol use as much as possible.  I agree with your primary care provider that you should avoid taking NSAIDs.  I also encourage you to start taking an over-the-counter proton pump inhibitor (PPI) such as Prilosec, Nexium, pantoprazole, or other similar medication.  You do not need a prescription for these and should be able to find it at any pharmacy.    Return to the emergency department if you develop new or worsening symptoms that concern you.

## 2020-05-12 LAB — TYPE AND SCREEN
ABO/RH(D): O POS
Antibody Screen: NEGATIVE
Unit division: 0

## 2020-05-12 LAB — BPAM RBC
Blood Product Expiration Date: 202108242359
ISSUE DATE / TIME: 202107290204
Unit Type and Rh: 5100

## 2020-05-15 LAB — ABO/RH: ABO/RH(D): O POS

## 2020-05-17 ENCOUNTER — Emergency Department: Payer: BC Managed Care – PPO

## 2020-05-17 ENCOUNTER — Other Ambulatory Visit: Payer: Self-pay

## 2020-05-17 ENCOUNTER — Observation Stay: Payer: BC Managed Care – PPO

## 2020-05-17 ENCOUNTER — Inpatient Hospital Stay
Admission: EM | Admit: 2020-05-17 | Discharge: 2020-05-20 | DRG: 440 | Disposition: A | Discharge status: Home / Self Care | Payer: BC Managed Care – PPO | Attending: Internal Medicine | Admitting: Internal Medicine

## 2020-05-17 DIAGNOSIS — Z87891 Personal history of nicotine dependence: Secondary | ICD-10-CM

## 2020-05-17 DIAGNOSIS — Z66 Do not resuscitate: Secondary | ICD-10-CM | POA: Diagnosis present

## 2020-05-17 DIAGNOSIS — R109 Unspecified abdominal pain: Secondary | ICD-10-CM | POA: Diagnosis not present

## 2020-05-17 DIAGNOSIS — K852 Alcohol induced acute pancreatitis without necrosis or infection: Secondary | ICD-10-CM | POA: Diagnosis not present

## 2020-05-17 DIAGNOSIS — K859 Acute pancreatitis without necrosis or infection, unspecified: Secondary | ICD-10-CM | POA: Diagnosis not present

## 2020-05-17 DIAGNOSIS — R400 Somnolence: Secondary | ICD-10-CM | POA: Diagnosis present

## 2020-05-17 DIAGNOSIS — K760 Fatty (change of) liver, not elsewhere classified: Secondary | ICD-10-CM | POA: Diagnosis present

## 2020-05-17 DIAGNOSIS — Z8249 Family history of ischemic heart disease and other diseases of the circulatory system: Secondary | ICD-10-CM

## 2020-05-17 DIAGNOSIS — E876 Hypokalemia: Secondary | ICD-10-CM

## 2020-05-17 DIAGNOSIS — F1010 Alcohol abuse, uncomplicated: Secondary | ICD-10-CM | POA: Diagnosis present

## 2020-05-17 DIAGNOSIS — J45909 Unspecified asthma, uncomplicated: Secondary | ICD-10-CM

## 2020-05-17 DIAGNOSIS — D509 Iron deficiency anemia, unspecified: Secondary | ICD-10-CM | POA: Diagnosis present

## 2020-05-17 DIAGNOSIS — I10 Essential (primary) hypertension: Secondary | ICD-10-CM | POA: Diagnosis present

## 2020-05-17 DIAGNOSIS — Z20822 Contact with and (suspected) exposure to covid-19: Secondary | ICD-10-CM | POA: Diagnosis present

## 2020-05-17 DIAGNOSIS — D649 Anemia, unspecified: Secondary | ICD-10-CM

## 2020-05-17 LAB — URINALYSIS, COMPLETE (UACMP) WITH MICROSCOPIC
Bilirubin Urine: NEGATIVE
Glucose, UA: NEGATIVE mg/dL
Hgb urine dipstick: NEGATIVE
Ketones, ur: 20 mg/dL — AB
Nitrite: NEGATIVE
Protein, ur: 30 mg/dL — AB
Specific Gravity, Urine: 1.019 (ref 1.005–1.030)
pH: 6 (ref 5.0–8.0)

## 2020-05-17 LAB — COMPREHENSIVE METABOLIC PANEL
ALT: 44 U/L (ref 0–44)
AST: 45 U/L — ABNORMAL HIGH (ref 15–41)
Albumin: 3.6 g/dL (ref 3.5–5.0)
Alkaline Phosphatase: 89 U/L (ref 38–126)
Anion gap: 9 (ref 5–15)
BUN: 13 mg/dL (ref 8–23)
CO2: 20 mmol/L — ABNORMAL LOW (ref 22–32)
Calcium: 11.2 mg/dL — ABNORMAL HIGH (ref 8.9–10.3)
Chloride: 108 mmol/L (ref 98–111)
Creatinine, Ser: 0.75 mg/dL (ref 0.44–1.00)
GFR calc Af Amer: >60 mL/min (ref >60)
GFR calc non Af Amer: >60 mL/min (ref >60)
Glucose, Bld: 87 mg/dL (ref 70–99)
Potassium: 3.1 mmol/L — ABNORMAL LOW (ref 3.5–5.1)
Sodium: 137 mmol/L (ref 135–145)
Total Bilirubin: 0.8 mg/dL (ref 0.3–1.2)
Total Protein: 8 g/dL (ref 6.5–8.1)

## 2020-05-17 LAB — CBC
HCT: 30.8 % — ABNORMAL LOW (ref 36.0–46.0)
Hemoglobin: 9.7 g/dL — ABNORMAL LOW (ref 12.0–15.0)
MCH: 27.3 pg (ref 26.0–34.0)
MCHC: 31.5 g/dL (ref 30.0–36.0)
MCV: 86.8 fL (ref 80.0–100.0)
Platelets: 620 10*3/uL — ABNORMAL HIGH (ref 150–400)
RBC: 3.55 MIL/uL — ABNORMAL LOW (ref 3.87–5.11)
RDW: 20.1 % — ABNORMAL HIGH (ref 11.5–15.5)
WBC: 11.4 10*3/uL — ABNORMAL HIGH (ref 4.0–10.5)
nRBC: 0 % (ref 0.0–0.2)

## 2020-05-17 LAB — SARS CORONAVIRUS 2 BY RT PCR (HOSPITAL ORDER, PERFORMED IN ~~LOC~~ HOSPITAL LAB): SARS Coronavirus 2: NEGATIVE

## 2020-05-17 LAB — LIPASE, BLOOD: Lipase: 275 U/L — ABNORMAL HIGH (ref 11–51)

## 2020-05-17 LAB — TROPONIN I (HIGH SENSITIVITY): Troponin I (High Sensitivity): 4 ng/L (ref <18)

## 2020-05-17 LAB — MAGNESIUM: Magnesium: 1.7 mg/dL (ref 1.7–2.4)

## 2020-05-17 MED ORDER — LACTATED RINGERS IV BOLUS
1000.0000 mL | Freq: Once | INTRAVENOUS | Status: AC
  Administered 2020-05-17: 1000 mL via INTRAVENOUS

## 2020-05-17 MED ORDER — ONDANSETRON HCL 4 MG PO TABS: 4.0000 mg | ORAL_TABLET | Freq: Four times a day (QID) | ORAL | Status: DC | PRN

## 2020-05-17 MED ORDER — MORPHINE SULFATE (PF) 4 MG/ML IV SOLN
4.0000 mg | Freq: Once | INTRAVENOUS | Status: AC
  Administered 2020-05-17: 4 mg via INTRAVENOUS
  Filled 2020-05-17: qty 1

## 2020-05-17 MED ORDER — MOMETASONE FURO-FORMOTEROL FUM 200-5 MCG/ACT IN AERO
2.0000 | INHALATION_SPRAY | Freq: Two times a day (BID) | RESPIRATORY_TRACT | Status: DC
  Administered 2020-05-18 – 2020-05-20 (×5): 2 via RESPIRATORY_TRACT
  Filled 2020-05-17: qty 8.8

## 2020-05-17 MED ORDER — OXYCODONE HCL 5 MG PO TABS
5.0000 mg | ORAL_TABLET | ORAL | Status: DC | PRN
  Administered 2020-05-18 – 2020-05-20 (×6): 5 mg via ORAL
  Filled 2020-05-17 (×6): qty 1

## 2020-05-17 MED ORDER — MAGNESIUM SULFATE IN D5W 1-5 GM/100ML-% IV SOLN
1.0000 g | Freq: Once | INTRAVENOUS | Status: AC
  Administered 2020-05-17: 1 g via INTRAVENOUS
  Filled 2020-05-17: qty 100

## 2020-05-17 MED ORDER — ONDANSETRON 4 MG PO TBDP: 4.0000 mg | ORAL_TABLET | Freq: Once | ORAL | Status: AC

## 2020-05-17 MED ORDER — ONDANSETRON HCL 4 MG/2ML IJ SOLN
4.0000 mg | Freq: Four times a day (QID) | INTRAMUSCULAR | Status: DC | PRN
  Administered 2020-05-18: 4 mg via INTRAVENOUS
  Filled 2020-05-17: qty 2

## 2020-05-17 MED ORDER — ACETAMINOPHEN 650 MG RE SUPP: 650.0000 mg | Freq: Four times a day (QID) | RECTAL | Status: DC | PRN

## 2020-05-17 MED ORDER — POTASSIUM CHLORIDE 20 MEQ/15ML (10%) PO SOLN
40.0000 meq | Freq: Once | ORAL | Status: AC
  Administered 2020-05-17: 40 meq via ORAL
  Filled 2020-05-17: qty 30

## 2020-05-17 MED ORDER — ONDANSETRON 4 MG PO TBDP
ORAL_TABLET | ORAL | Status: AC
  Administered 2020-05-17: 4 mg via ORAL
  Filled 2020-05-17: qty 1

## 2020-05-17 MED ORDER — ONDANSETRON HCL 4 MG/2ML IJ SOLN
4.0000 mg | Freq: Once | INTRAMUSCULAR | Status: AC
  Administered 2020-05-17: 4 mg via INTRAVENOUS
  Filled 2020-05-17: qty 2

## 2020-05-17 MED ORDER — FOLIC ACID 1 MG PO TABS
1.0000 mg | ORAL_TABLET | Freq: Every day | ORAL | Status: DC
  Administered 2020-05-17 – 2020-05-20 (×4): 1 mg via ORAL
  Filled 2020-05-17 (×4): qty 1

## 2020-05-17 MED ORDER — ENOXAPARIN SODIUM 40 MG/0.4ML ~~LOC~~ SOLN
40.0000 mg | SUBCUTANEOUS | Status: DC
  Administered 2020-05-17 – 2020-05-19 (×3): 40 mg via SUBCUTANEOUS
  Filled 2020-05-17 (×3): qty 0.4

## 2020-05-17 MED ORDER — ACETAMINOPHEN 325 MG PO TABS: 650.0000 mg | ORAL_TABLET | Freq: Four times a day (QID) | ORAL | Status: DC | PRN

## 2020-05-17 MED ORDER — SENNOSIDES-DOCUSATE SODIUM 8.6-50 MG PO TABS: 1.0000 | ORAL_TABLET | Freq: Every evening | ORAL | Status: DC | PRN

## 2020-05-17 MED ORDER — MORPHINE SULFATE (PF) 2 MG/ML IV SOLN
1.0000 mg | INTRAVENOUS | Status: DC | PRN
  Administered 2020-05-17 – 2020-05-18 (×2): 1 mg via INTRAVENOUS
  Filled 2020-05-17 (×2): qty 1

## 2020-05-17 MED ORDER — FERROUS SULFATE 325 (65 FE) MG PO TABS
325.0000 mg | ORAL_TABLET | Freq: Every day | ORAL | Status: DC
  Administered 2020-05-18 – 2020-05-20 (×3): 325 mg via ORAL
  Filled 2020-05-17 (×3): qty 1

## 2020-05-17 MED ORDER — ALBUTEROL SULFATE (2.5 MG/3ML) 0.083% IN NEBU: 3.0000 mL | INHALATION_SOLUTION | RESPIRATORY_TRACT | Status: DC | PRN

## 2020-05-17 MED ORDER — IOHEXOL 300 MG/ML  SOLN
100.0000 mL | Freq: Once | INTRAMUSCULAR | Status: AC | PRN
  Administered 2020-05-17: 100 mL via INTRAVENOUS

## 2020-05-17 MED ORDER — POTASSIUM CHLORIDE 2 MEQ/ML IV SOLN
INTRAVENOUS | Status: AC
  Filled 2020-05-17 (×2): qty 1000

## 2020-05-17 NOTE — ED Triage Notes (Signed)
Pt states generalized abd pain and weakness. Pt was seen last week, states received a blood transfusion. Pt states she was so weak this am she could barely stand. Pt denies known dark tarry stools.

## 2020-05-17 NOTE — ED Triage Notes (Signed)
First Nurse Note:  Arrives today c/o abdominal pain and nausea.  States was sent to ED today because WBC elevated.  Patient is AAOx3.  Skin warm and dry. NAD

## 2020-05-17 NOTE — ED Notes (Signed)
Pt to US at this time.

## 2020-05-17 NOTE — ED Provider Notes (Signed)
[facility] Emergency Department Provider Note   ____________________________________________   First MD Initiated Contact with Patient 05/17/20 1622     (approximate)  I have reviewed the triage vital signs and the nursing notes.   HISTORY  Chief Complaint Abdominal Pain    HPI Linda Mcgee is a 64 y.o. female with past medical history of hypertension, asthma, and anemia who presents to the ED complaining of abdominal pain.  Patient reports that she has been dealing with intermittent pain in her abdomen for about the past week.  She describes it as sharp and not exacerbated or alleviated by anything, will often affect her epigastrium but currently the majority of her pain is in her bilateral lower quadrants.  There is been associated with nausea and one episode of nonbilious and nonbloody vomiting earlier today.  She denies any changes in her bowel movements and has not had any dysuria or hematuria.  She also denies any fevers, cough, chest pain, or shortness of breath.  She was seen by her PCP earlier today and referred to the ED for further evaluation.        Past Medical History:  Diagnosis Date  . Anemia   . Asthma   . Hypertension     There are no problems to display for this patient.   Past Surgical History:  Procedure Laterality Date  . LAPAROSCOPIC OOPHERECTOMY Left     Prior to Admission medications   Not on File    Allergies Patient has no known allergies.  No family history on file.  Social History Social History   Tobacco Use  . Smoking status: Current Some Day Smoker  . Smokeless tobacco: Never Used  Substance Use Topics  . Alcohol use: Not on file  . Drug use: Not on file    Review of Systems  Constitutional: No fever/chills.  Positive for generalized weakness. Eyes: No visual changes. ENT: No sore throat. Cardiovascular: Denies chest pain. Respiratory: Denies shortness of breath. Gastrointestinal:  Positive for abdominal pain, nausea, and vomiting.  No diarrhea.  No constipation. Genitourinary: Negative for dysuria. Musculoskeletal: Negative for back pain. Skin: Negative for rash. Neurological: Negative for headaches, focal weakness or numbness.  ____________________________________________   PHYSICAL EXAM:  VITAL SIGNS: ED Triage Vitals  Enc Vitals Group     BP 05/17/20 1255 (!) 137/94     Pulse Rate 05/17/20 1255 97     Resp 05/17/20 1255 16     Temp 05/17/20 1255 98.4 F (36.9 C)     Temp Source 05/17/20 1255 Oral     SpO2 05/17/20 1255 100 %     Weight 05/17/20 1256 144 lb (65.3 kg)     Height 05/17/20 1256 5' (1.524 m)     Head Circumference --      Peak Flow --      Pain Score 05/17/20 1256 7     Pain Loc --      Pain Edu? --      Excl. in [place]? --     Constitutional: Alert and oriented. Eyes: Conjunctivae are normal. Head: Atraumatic. Nose: No congestion/rhinnorhea. Mouth/Throat: Mucous membranes are moist. Neck: Normal ROM Cardiovascular: Normal rate, regular rhythm. Grossly normal heart sounds. Respiratory: Normal respiratory effort.  No retractions. Lungs CTAB. Gastrointestinal: Soft and tender to palpation in bilateral lower quadrants with no rebound or guarding. No distention. Genitourinary: deferred Musculoskeletal: No lower extremity tenderness nor edema. Neurologic:  Normal speech and language. No gross focal neurologic deficits  are appreciated. Skin:  Skin is warm, dry and intact. No rash noted. Psychiatric: Mood and affect are normal. Speech and behavior are normal.  ____________________________________________   LABS (all labs ordered are listed, but only abnormal results are displayed)  Labs Reviewed  COMPREHENSIVE METABOLIC PANEL - Abnormal; Notable for the following components:      Result Value   Potassium 3.1 (*)    CO2 20 (*)    Calcium 11.2 (*)    AST 45 (*)    All other components within normal limits  CBC - Abnormal; Notable  for the following components:   WBC 11.4 (*)    RBC 3.55 (*)    Hemoglobin 9.7 (*)    HCT 30.8 (*)    RDW 20.1 (*)    Platelets 620 (*)    All other components within normal limits  URINALYSIS, COMPLETE (UACMP) WITH MICROSCOPIC - Abnormal; Notable for the following components:   Color, Urine YELLOW (*)    APPearance CLOUDY (*)    Ketones, ur 20 (*)    Protein, ur 30 (*)    Leukocytes,Ua MODERATE (*)    Bacteria, UA RARE (*)    All other components within normal limits  LIPASE, BLOOD - Abnormal; Notable for the following components:   Lipase 275 (*)    All other components within normal limits  URINE CULTURE  SARS CORONAVIRUS 2 BY RT PCR (HOSPITAL ORDER, [facility])  TROPONIN I (HIGH SENSITIVITY)   ____________________________________________  EKG  ED ECG REPORT I, Blake Divine, the attending physician, personally viewed and interpreted this ECG.   Date: 05/17/2020  EKG Time: 12:55  Rate: 97  Rhythm: normal sinus rhythm  Axis: Normal  Intervals:none  ST&T Change: None   PROCEDURES  Procedure(s) performed (including Critical Care):  Procedures   ____________________________________________   INITIAL IMPRESSION / ASSESSMENT AND PLAN / ED COURSE       64 year old female with past medical history of hypertension, asthma, and anemia presents to the ED complaining of intermittent abdominal pain for the past week becoming more severe over the past couple of days with nausea and an episode of vomiting earlier today.  She has some tenderness in her bilateral lower quadrants and we will further assess with CT scan.  Lab work thus far is reassuring, her hemoglobin is trending upwards and she denies any recent bleeding.  LFTs and electrolytes are unremarkable, lipase pending.  UA sample is contaminated but we will hold off on treatment given patient denies any urinary symptoms at this time.  We will treat symptomatically with IV fluids, morphine,  and Zofran.  Lipase elevated to 275 and CT scan shows mild to moderate pancreatic inflammation with no biliary pathology.  I suspect her pancreatitis is due to alcohol use as she admits to drinking 4 bottles of wine weekly.  Symptoms improved following IV fluids and medications.  Given this is her first episode of pancreatitis, case discussed with hospitalist for admission.      ____________________________________________   FINAL CLINICAL IMPRESSION(S) / ED DIAGNOSES  Final diagnoses:  Alcohol-induced acute pancreatitis without infection or necrosis     ED Discharge Orders    None       Note:  This document was prepared using Dragon voice recognition software and may include unintentional dictation errors.   Blake Divine, MD 05/17/20 [phone]

## 2020-05-17 NOTE — ED Notes (Signed)
C/O Nausea.

## 2020-05-17 NOTE — H&P (Signed)
History and Physical    Linda Mcgee:[id number] DOB: [birth year] DOA: 05/17/2020  PCP: [name], MD  Patient coming from: Home  I have personally briefly reviewed patient's old medical records in [place]  Chief Complaint: Abdominal pain  HPI: Linda Mcgee is a 64 y.o. female with medical history significant for hypertension, asthma, anemia, who presents to the ED for evaluation of abdominal pain.  Patient reports new onset of epigastric pain beginning about 1 week ago.  She was seen by her PCP with initial lab work showing anemia with hemoglobin 7.5.  She was sent to the ED for evaluation and was transfused with 1 unit PRBC before discharge to home.  Further outpatient lab work returned with elevated lipase of 1150.  She was having mild symptoms therefore she was treated conservatively on outpatient basis.  She had been advised to maintain a liquid diet which she reports adherence to.  She returned to see her PCP earlier today on 05/17/2020 and was noted to have elevated WBC to 12.4 with lipase 466.  She was advised to return to the ED for further evaluation and work-up.  Patient states that she is having some continued abdominal pain, now with nausea lower abdominal region.  She had new onset nausea and emesis earlier today.  She otherwise denies any subjective fevers, diaphoresis, chest pain, dyspnea, dysuria, or diarrhea.  She does report significant alcohol use with 1 bottle of wine each day Tuesday through Sunday.  She denies any other alcohol use.  She reports a remote history of tobacco use and denies any illicit drug use.  She denies any recent changes in her medications.  ED Course:  Initial vitals showed BP 137/94, pulse 97, RR 16, temp 98.4 Fahrenheit, SPO2 100% on room air.  Labs are notable for lipase 275, WBC 11.4, hemoglobin 9.7, platelets 620,000, sodium 137, potassium 3.1, bicarb 20, BUN 13, creatinine 0.75, serum glucose 87, AST 45, ALT 44, alk  phos 89, total bilirubin 0.8.  Urinalysis showed negative nitrites, moderate leukocytes, 6-10 RBC/hpf, 21-50 WBC/hpf, rare bacteria microscopy.  SARS-CoV-2 PCR is ordered and pending.  CT abdomen/pelvis with contrast showed mild to moderate severity acute pancreatitis, hepatic steatosis, uterine fibroids, and aortic atherosclerosis.  Patient was given 1 L LR, IV morphine, and IV Zofran.  The hospitalist service was consulted to admit for further evaluation and management.  Review of Systems: All systems reviewed and are negative except as documented in history of present illness above.   Past Medical History:  Diagnosis Date   Anemia    Asthma    Hypertension     Past Surgical History:  Procedure Laterality Date   LAPAROSCOPIC OOPHERECTOMY Left     Social History:  reports that she has quit smoking. She has never used smokeless tobacco. She reports current alcohol use. She reports that she does not use drugs.  No Known Allergies  Family History  Problem Relation Age of Onset   Hypertension Mother      Prior to Admission medications   Not on File    Physical Exam: Vitals:   05/17/20 1255 05/17/20 1256 05/17/20 1515  BP: (!) 137/94  130/90  Pulse: 97  94  Resp: 16  16  Temp: 98.4 F (36.9 C)    TempSrc: Oral    SpO2: 100%  100%  Weight:  65.3 kg   Height:  5' (1.524 m)    Constitutional: Resting supine in bed, NAD, calm, comfortable Eyes: PERRL,  and conjunctivae normal °ENMT: Mucous membranes are moist. Posterior pharynx clear of any exudate or lesions.Normal dentition.  °Neck: normal, supple, no masses. °Respiratory: clear to auscultation bilaterally, no wheezing, no crackles. Normal respiratory effort. No accessory muscle use.  °Cardiovascular: Regular rate and rhythm, no murmurs / rubs / gallops. No extremity edema. 2+ pedal pulses. °Abdomen: no tenderness, no masses palpated. No hepatosplenomegaly. Bowel sounds positive.  °Musculoskeletal: no clubbing /  cyanosis. No joint deformity upper and lower extremities. Good ROM, no contractures. Normal muscle tone.  °Skin: no rashes, lesions, ulcers. No induration °Neurologic: CN 2-12 grossly intact. Sensation intact, Strength 5/5 in all 4.  °Psychiatric: Normal judgment and insight. Alert and oriented x 3. Normal mood.  ° °Labs on Admission: I have personally reviewed following labs and imaging studies ° °CBC: °Recent Labs  °Lab 05/11/20 °0008 05/11/20 °0432 05/17/20 °1302  °WBC 10.2   9.9  --  11.4*  °NEUTROABS 7.4  --   --   °HGB 7.6*   7.4* 8.7* 9.7*  °HCT 25.9*   25.3* 28.0* 30.8*  °MCV 91.5   93.0  --  86.8  °PLT 503*   502*  --  620*  ° °Basic Metabolic Panel: °Recent Labs  °Lab 05/17/20 °1302  °NA 137  °K 3.1*  °CL 108  °CO2 20*  °GLUCOSE 87  °BUN 13  °CREATININE 0.75  °CALCIUM 11.2*  °MG 1.7  ° °GFR: °Estimated Creatinine Clearance: 60.7 mL/min (by C-G formula based on SCr of 0.75 mg/dL). °Liver Function Tests: °Recent Labs  °Lab 05/17/20 °1302  °AST 45*  °ALT 44  °ALKPHOS 89  °BILITOT 0.8  °PROT 8.0  °ALBUMIN 3.6  ° °Recent Labs  °Lab 05/17/20 °1302  °LIPASE 275*  ° °No results for input(s): AMMONIA in the last 168 hours. °Coagulation Profile: °No results for input(s): INR, PROTIME in the last 168 hours. °Cardiac Enzymes: °No results for input(s): CKTOTAL, CKMB, CKMBINDEX, TROPONINI in the last 168 hours. °BNP (last 3 results) °No results for input(s): PROBNP in the last 8760 hours. °HbA1C: °No results for input(s): HGBA1C in the last 72 hours. °CBG: °No results for input(s): GLUCAP in the last 168 hours. °Lipid Profile: °No results for input(s): CHOL, HDL, LDLCALC, TRIG, CHOLHDL, LDLDIRECT in the last 72 hours. °Thyroid Function Tests: °No results for input(s): TSH, T4TOTAL, FREET4, T3FREE, THYROIDAB in the last 72 hours. °Anemia Panel: °No results for input(s): VITAMINB12, FOLATE, FERRITIN, TIBC, IRON, RETICCTPCT in the last 72 hours. °Urine analysis: °   °Component Value Date/Time  ° COLORURINE YELLOW (A)  05/17/2020 1302  ° APPEARANCEUR CLOUDY (A) 05/17/2020 1302  ° LABSPEC 1.019 05/17/2020 1302  ° PHURINE 6.0 05/17/2020 1302  ° GLUCOSEU NEGATIVE 05/17/2020 1302  ° HGBUR NEGATIVE 05/17/2020 1302  ° BILIRUBINUR NEGATIVE 05/17/2020 1302  ° KETONESUR 20 (A) 05/17/2020 1302  ° PROTEINUR 30 (A) 05/17/2020 1302  ° NITRITE NEGATIVE 05/17/2020 1302  ° LEUKOCYTESUR MODERATE (A) 05/17/2020 1302  ° ° °Radiological Exams on Admission: °CT Abdomen Pelvis W Contrast ° °Result Date: 05/17/2020 °CLINICAL DATA:  Abdominal pain. EXAM: CT ABDOMEN AND PELVIS WITH CONTRAST TECHNIQUE: Multidetector CT imaging of the abdomen and pelvis was performed using the standard protocol following bolus administration of intravenous contrast. CONTRAST:  100mL OMNIPAQUE IOHEXOL 300 MG/ML  SOLN COMPARISON:  July 01, 2011 FINDINGS: Lower chest: No acute abnormality. Hepatobiliary: There is diffuse fatty infiltration of the liver parenchyma. No focal liver abnormality is seen. No gallstones, gallbladder wall thickening, or biliary dilatation. Pancreas: There is mild   to moderate severity peripancreatic inflammatory fat stranding. Spleen: Normal in size without focal abnormality. Adrenals/Urinary Tract: Adrenal glands are unremarkable. Kidneys are normal, without renal calculi, focal lesion, or hydronephrosis. Bladder is unremarkable. Stomach/Bowel: Stomach is within normal limits. Appendix appears normal. No evidence of bowel wall thickening, distention, or inflammatory changes. Vascular/Lymphatic: There is mild calcification of the abdominal aorta without evidence of aneurysmal dilatation. No enlarged abdominal or pelvic lymph nodes. Reproductive: Multiple heterogeneous fibroids are seen within the uterine fundus. The largest measures approximately 2.6 cm x 1.5 cm. The bilateral adnexa are unremarkable. Other: No abdominal wall hernia or abnormality. No abdominopelvic ascites. Musculoskeletal: No acute or significant osseous findings. IMPRESSION: 1.  Mild to moderate severity acute pancreatitis. 2. Hepatic steatosis. 3. Uterine fibroids. 4. Aortic atherosclerosis. Aortic Atherosclerosis (ICD10-I70.0). Electronically Signed   By: Thaddeus  Houston M.D.   On: 05/17/2020 18:06  ° ° °EKG: Independently reviewed. Sinus rhythm without acute ischemic changes. ° °Assessment/Plan °Principal Problem: °  Acute pancreatitis °Active Problems: °  Hypertension °  Asthma °  Anemia °  Hypokalemia ° °Linda Mcgee is a 63 y.o. female with medical history significant for hypertension, asthma, anemia, who is admitted with acute pancreatitis. ° °Acute pancreatitis: °Suspect associated with alcohol use.  CT abdomen/pelvis without evidence of gallstones, gallbladder wall thickening, or biliary dilatation.  Less likely due to losartan/HCTZ use. °-Obtain RUQ ultrasound °-Continue supportive care with IV fluids, antiemetics, and analgesics overnight °-Keep n.p.o. except sips with meds for now ° °Hypokalemia: °-Replete orally and with IV fluids °-Check magnesium ° °Hypertension: °Currently normotensive.  Holding home losartan/HCTZ for now. ° °Normocytic anemia: °Labs 05/11/2020 suggestive of iron deficiency anemia with serum iron 14, TIBC 382, saturation 4%.  Labs also showed ferritin 23, B12 5180, and folate 4.8. °-Add iron and folate supplement ° °Asthma: °Chronic and stable.  Continue Dulera and as needed albuterol. ° °Alcohol use: °Advised on alcohol reduction/cessation. ° °DVT prophylaxis: Lovenox °Code Status: DNR, confirmed with patient °Family Communication: Discussed with patient, she has discussed with family °Disposition Plan: From home and likely discharge to home pending symptomatic improvement and ability to maintain adequate oral intake °Consults called: None °Admission status:  °Status is: Observation ° °The patient remains OBS appropriate and will d/c before 2 midnights. ° °Dispo: The patient is from: Home °             Anticipated d/c is to: Home °              Anticipated d/c date is: 1 day pending symptomatic improvement and ability maintain adequate oral intake. °             Patient currently is not medically stable to d/c. ° ° °Vishal Patel MD °[facility] ° °If 7PM-7AM, please contact night-coverage [email or web address] ° °05/17/2020, 7:23 PM  ° ° °

## 2020-05-18 DIAGNOSIS — D509 Iron deficiency anemia, unspecified: Secondary | ICD-10-CM | POA: Diagnosis not present

## 2020-05-18 DIAGNOSIS — I10 Essential (primary) hypertension: Secondary | ICD-10-CM | POA: Diagnosis not present

## 2020-05-18 DIAGNOSIS — K859 Acute pancreatitis without necrosis or infection, unspecified: Secondary | ICD-10-CM | POA: Diagnosis not present

## 2020-05-18 LAB — COMPREHENSIVE METABOLIC PANEL
ALT: 33 U/L (ref 0–44)
AST: 27 U/L (ref 15–41)
Albumin: 2.8 g/dL — ABNORMAL LOW (ref 3.5–5.0)
Alkaline Phosphatase: 72 U/L (ref 38–126)
Anion gap: 8 (ref 5–15)
BUN: 8 mg/dL (ref 8–23)
CO2: 21 mmol/L — ABNORMAL LOW (ref 22–32)
Calcium: 10.7 mg/dL — ABNORMAL HIGH (ref 8.9–10.3)
Chloride: 106 mmol/L (ref 98–111)
Creatinine, Ser: 0.61 mg/dL (ref 0.44–1.00)
GFR calc Af Amer: >60 mL/min (ref >60)
GFR calc non Af Amer: >60 mL/min (ref >60)
Glucose, Bld: 69 mg/dL — ABNORMAL LOW (ref 70–99)
Potassium: 3.8 mmol/L (ref 3.5–5.1)
Sodium: 135 mmol/L (ref 135–145)
Total Bilirubin: 0.8 mg/dL (ref 0.3–1.2)
Total Protein: 6.3 g/dL — ABNORMAL LOW (ref 6.5–8.1)

## 2020-05-18 LAB — URINE CULTURE

## 2020-05-18 LAB — CBC
HCT: 26 % — ABNORMAL LOW (ref 36.0–46.0)
Hemoglobin: 8.5 g/dL — ABNORMAL LOW (ref 12.0–15.0)
MCH: 27.4 pg (ref 26.0–34.0)
MCHC: 32.7 g/dL (ref 30.0–36.0)
MCV: 83.9 fL (ref 80.0–100.0)
Platelets: 558 10*3/uL — ABNORMAL HIGH (ref 150–400)
RBC: 3.1 MIL/uL — ABNORMAL LOW (ref 3.87–5.11)
RDW: 20.6 % — ABNORMAL HIGH (ref 11.5–15.5)
WBC: 12.8 10*3/uL — ABNORMAL HIGH (ref 4.0–10.5)
nRBC: 0 % (ref 0.0–0.2)

## 2020-05-18 LAB — MAGNESIUM: Magnesium: 1.7 mg/dL (ref 1.7–2.4)

## 2020-05-18 LAB — LIPASE, BLOOD: Lipase: 103 U/L — ABNORMAL HIGH (ref 11–51)

## 2020-05-18 LAB — HIV ANTIBODY (ROUTINE TESTING W REFLEX): HIV Screen 4th Generation wRfx: NONREACTIVE

## 2020-05-18 MED ORDER — THIAMINE HCL 100 MG/ML IJ SOLN: 100.0000 mg | Freq: Every day | INTRAMUSCULAR | Status: DC

## 2020-05-18 MED ORDER — LORAZEPAM 1 MG PO TABS: 1.0000 mg | ORAL_TABLET | ORAL | Status: DC | PRN

## 2020-05-18 MED ORDER — FOLIC ACID 1 MG PO TABS: 1.0000 mg | ORAL_TABLET | Freq: Every day | ORAL | Status: DC

## 2020-05-18 MED ORDER — THIAMINE HCL 100 MG PO TABS
100.0000 mg | ORAL_TABLET | Freq: Every day | ORAL | Status: DC
  Administered 2020-05-18 – 2020-05-20 (×3): 100 mg via ORAL
  Filled 2020-05-18 (×3): qty 1

## 2020-05-18 MED ORDER — LORAZEPAM 2 MG PO TABS: 0.0000 mg | ORAL_TABLET | Freq: Two times a day (BID) | ORAL | Status: DC

## 2020-05-18 MED ORDER — ADULT MULTIVITAMIN W/MINERALS CH
1.0000 | ORAL_TABLET | Freq: Every day | ORAL | Status: DC
  Administered 2020-05-18 – 2020-05-20 (×3): 1 via ORAL
  Filled 2020-05-18 (×3): qty 1

## 2020-05-18 MED ORDER — SODIUM CHLORIDE 0.9 % IV SOLN: INTRAVENOUS | Status: DC

## 2020-05-18 MED ORDER — LORAZEPAM 2 MG PO TABS: 0.0000 mg | ORAL_TABLET | Freq: Four times a day (QID) | ORAL | Status: DC

## 2020-05-18 MED ORDER — LORAZEPAM 2 MG/ML IJ SOLN: 1.0000 mg | INTRAMUSCULAR | Status: DC | PRN

## 2020-05-18 NOTE — Progress Notes (Signed)
PROGRESS NOTE  Linda Mcgee VOJ:[id number] DOB: [birth year] DOA: 05/17/2020 PCP: Linda Pink, MD  HPI/Recap of past 24 hours: HPI from Dr Linda Mcgee is a 64 y.o. female with medical history significant for hypertension, asthma, anemia, who presents to the ED for evaluation of abdominal pain.  Patient reports new onset of epigastric pain beginning about 1 week ago.  She was seen by her PCP with initial lab work showing anemia with hemoglobin 7.5.  She was sent to the ED for evaluation and was transfused with 1 unit PRBC before discharge to home.  Further outpatient lab work returned with elevated lipase of 1150.  She was having mild symptoms therefore she was treated conservatively on outpatient basis.  She had been advised to maintain a liquid diet which she reports adherence to. Pt returned to see her PCP on 05/17/2020 and was noted to have elevated WBC to 12.4 with lipase 466. Patient states that she is having some continued abdominal pain, nausea and emesis. Pt does report significant alcohol use with 1 bottle of wine each day Tuesday through Sunday. In the ED, initial vitals showed BP 137/94, pulse 97, RR 16, temp 98.4 Fahrenheit, SPO2 100% on room air. Labs are notable for lipase 275, WBC 11.4, hemoglobin 9.7, urinalysis showed negative nitrites, moderate leukocytes, 21-50 WBC/hpf, rare bacteria microscopy. CT abdomen/pelvis with contrast showed mild to moderate severity acute pancreatitis, hepatic steatosis.  Patient admitted for further management.    Today, patient still reporting some nausea and generalized abdominal pain.  As of this morning, unable to tolerate clear liquid diet, became nauseous after a small bowl of broth.   Assessment/Plan: Principal Problem:   Acute pancreatitis Active Problems:   Hypertension   Asthma   Anemia   Hypokalemia  Acute pancreatitis Likely 2/2 alcohol use, less likely due to HCTZ use  Lipase trending down CT abdomen/pelvis  showed mild to moderate acute pancreatitis, hepatic steatosis, no evidence of gallstones/gallbladder wall thickening or biliary dilatation Right upper quadrant showed hepatic steatosis Continue IV fluids, antiemetics, pain control Advance diet as tolerated, currently on clear liquid diet Monitor closely  Leukocytosis Currently afebrile, likely reactive UA showed moderate leukocytes, 21-50 WBC, rare bacteria UC showed multiple species Monitor closely, daily CBC  Iron deficiency anemia Recent labs showed iron 14, TIBC 383, saturation 4%, ferritin 23, folate 4.8 Continue iron and folate supplement  Hypertension Continue to hold home losartan/HCTZ for now  Asthma Continue Dulera and as needed albuterol  Alcohol abuse Advised to quit CIWA protocol        Malnutrition Type:      Malnutrition Characteristics:      Nutrition Interventions:       Estimated body mass index is 28.37 kg/m as calculated from the following:   Height as of this encounter: 5' (1.524 m).   Weight as of this encounter: 65.9 kg.     Code Status: DNR  Family Communication: Discussed with patient  Disposition Plan: Status is: Observation  The patient remains OBS appropriate and will d/c before 2 midnights.  Dispo: The patient is from: Home              Anticipated d/c is to: Home              Anticipated d/c date is: 1 day              Patient currently is not medically stable to d/c.    Consultants:  None  Procedures:  None  Antimicrobials:  None  DVT prophylaxis: Lovenox   Objective: Vitals:   05/17/20 2234 05/17/20 2242 05/18/20 0528 05/18/20 1203  BP: (!) 150/95  131/68 136/79  Pulse: 96  95 86  Resp: 20     Temp: 97.8 F (36.6 C)  98.8 F (37.1 C) 98 F (36.7 C)  TempSrc: Oral  Oral   SpO2: 100%  100% 100%  Weight:  65.9 kg    Height:  5' (1.524 m)      Intake/Output Summary (Last 24 hours) at 05/18/2020 1416 Last data filed at 05/18/2020 1300 Gross per  24 hour  Intake 1400 ml  Output 2 ml  Net 1398 ml   Filed Weights   05/17/20 1256 05/17/20 2242  Weight: 65.3 kg 65.9 kg    Exam:  General: NAD   Cardiovascular: S1, S2 present  Respiratory: CTAB  Abdomen: Soft, +tender, nondistended, bowel sounds present  Musculoskeletal: No bilateral pedal edema noted  Skin: Normal  Psychiatry: Normal mood   Data Reviewed: CBC: Recent Labs  Lab 05/17/20 1302 05/18/20 0509  WBC 11.4* 12.8*  HGB 9.7* 8.5*  HCT 30.8* 26.0*  MCV 86.8 83.9  PLT 620* 545*   Basic Metabolic Panel: Recent Labs  Lab 05/17/20 1302 05/18/20 0509  NA 137 135  K 3.1* 3.8  CL 108 106  CO2 20* 21*  GLUCOSE 87 69*  BUN 13 8  CREATININE 0.75 0.61  CALCIUM 11.2* 10.7*  MG 1.7 1.7   GFR: Estimated Creatinine Clearance: 61 mL/min (by C-G formula based on SCr of 0.61 mg/dL). Liver Function Tests: Recent Labs  Lab 05/17/20 1302 05/18/20 0509  AST 45* 27  ALT 44 33  ALKPHOS 89 72  BILITOT 0.8 0.8  PROT 8.0 6.3*  ALBUMIN 3.6 2.8*   Recent Labs  Lab 05/17/20 1302 05/18/20 0509  LIPASE 275* 103*   No results for input(s): AMMONIA in the last 168 hours. Coagulation Profile: No results for input(s): INR, PROTIME in the last 168 hours. Cardiac Enzymes: No results for input(s): CKTOTAL, CKMB, CKMBINDEX, TROPONINI in the last 168 hours. BNP (last 3 results) No results for input(s): PROBNP in the last 8760 hours. HbA1C: No results for input(s): HGBA1C in the last 72 hours. CBG: No results for input(s): GLUCAP in the last 168 hours. Lipid Profile: No results for input(s): CHOL, HDL, LDLCALC, TRIG, CHOLHDL, LDLDIRECT in the last 72 hours. Thyroid Function Tests: No results for input(s): TSH, T4TOTAL, FREET4, T3FREE, THYROIDAB in the last 72 hours. Anemia Panel: No results for input(s): VITAMINB12, FOLATE, FERRITIN, TIBC, IRON, RETICCTPCT in the last 72 hours. Urine analysis:    Component Value Date/Time   COLORURINE YELLOW (A) 05/17/2020  1302   APPEARANCEUR CLOUDY (A) 05/17/2020 1302   LABSPEC 1.019 05/17/2020 1302   PHURINE 6.0 05/17/2020 1302   GLUCOSEU NEGATIVE 05/17/2020 1302   HGBUR NEGATIVE 05/17/2020 1302   BILIRUBINUR NEGATIVE 05/17/2020 1302   KETONESUR 20 (A) 05/17/2020 1302   PROTEINUR 30 (A) 05/17/2020 1302   NITRITE NEGATIVE 05/17/2020 1302   LEUKOCYTESUR MODERATE (A) 05/17/2020 1302   Sepsis Labs: @LABRCNTIP (procalcitonin:4,lacticidven:4)  ) Recent Results (from the past 240 hour(s))  Urine culture     Status: Abnormal   Collection Time: 05/17/20  1:02 PM   Specimen: Urine, Random  Result Value Ref Range Status   Specimen Description   Final    URINE, RANDOM Performed at [facility], [address], [place], [place] [address]    Special Requests   Final  NONE Performed at [facility], [address] [place], [place] [address]    Culture MULTIPLE SPECIES PRESENT, SUGGEST RECOLLECTION (A)  Final   Report Status 05/18/2020 FINAL  Final  SARS Coronavirus 2 by RT PCR (hospital order, performed in [facility] hospital lab) Nasopharyngeal Nasopharyngeal Swab     Status: None   Collection Time: 05/17/20  6:40 PM   Specimen: Nasopharyngeal Swab  Result Value Ref Range Status   SARS Coronavirus 2 NEGATIVE NEGATIVE Final    Comment: (NOTE) SARS-CoV-2 target nucleic acids are NOT DETECTED.  The SARS-CoV-2 RNA is generally detectable in upper and lower respiratory specimens during the acute phase of infection. The lowest concentration of SARS-CoV-2 viral copies this assay can detect is 250 copies / mL. A negative result does not preclude SARS-CoV-2 infection and should not be used as the sole basis for treatment or other patient management decisions.  A negative result may occur with improper specimen collection / handling, submission of specimen other than nasopharyngeal swab, presence of viral mutation(s) within the areas targeted by this assay, and inadequate number of viral  copies (<250 copies / mL). A negative result must be combined with clinical observations, patient history, and epidemiological information.  Fact Sheet for Patients:   StrictlyIdeas.no  Fact Sheet for Healthcare Providers: BankingDealers.co.za  This test is not yet approved or  cleared by the [place] FDA and has been authorized for detection and/or diagnosis of SARS-CoV-2 by FDA under an Emergency Use Authorization (EUA).  This EUA will remain in effect (meaning this test can be used) for the duration of the COVID-19 declaration under Section 564(b)(1) of the Act, 21 U.S.C. section 360bbb-3(b)(1), unless the authorization is terminated or revoked sooner.  Performed at [facility], [address], [place], [place] [address]       Studies: CT Abdomen Pelvis W Contrast  Result Date: 05/17/2020 CLINICAL DATA:  Abdominal pain. EXAM: CT ABDOMEN AND PELVIS WITH CONTRAST TECHNIQUE: Multidetector CT imaging of the abdomen and pelvis was performed using the standard protocol following bolus administration of intravenous contrast. CONTRAST:  171mL OMNIPAQUE IOHEXOL 300 MG/ML  SOLN COMPARISON:  July 01, 2011 FINDINGS: Lower chest: No acute abnormality. Hepatobiliary: There is diffuse fatty infiltration of the liver parenchyma. No focal liver abnormality is seen. No gallstones, gallbladder wall thickening, or biliary dilatation. Pancreas: There is mild to moderate severity peripancreatic inflammatory fat stranding. Spleen: Normal in size without focal abnormality. Adrenals/Urinary Tract: Adrenal glands are unremarkable. Kidneys are normal, without renal calculi, focal lesion, or hydronephrosis. Bladder is unremarkable. Stomach/Bowel: Stomach is within normal limits. Appendix appears normal. No evidence of bowel wall thickening, distention, or inflammatory changes. Vascular/Lymphatic: There is mild calcification of the abdominal aorta  without evidence of aneurysmal dilatation. No enlarged abdominal or pelvic lymph nodes. Reproductive: Multiple heterogeneous fibroids are seen within the uterine fundus. The largest measures approximately 2.6 cm x 1.5 cm. The bilateral adnexa are unremarkable. Other: No abdominal wall hernia or abnormality. No abdominopelvic ascites. Musculoskeletal: No acute or significant osseous findings. IMPRESSION: 1. Mild to moderate severity acute pancreatitis. 2. Hepatic steatosis. 3. Uterine fibroids. 4. Aortic atherosclerosis. Aortic Atherosclerosis (ICD10-I70.0). Electronically Signed   By: Virgina Norfolk M.D.   On: 05/17/2020 18:06   US Abdomen Limited RUQ  Result Date: 05/17/2020 CLINICAL DATA:  Intermittent abdominal pain EXAM: ULTRASOUND ABDOMEN LIMITED RIGHT UPPER QUADRANT COMPARISON:  CT dated 05/17/2020 FINDINGS: Gallbladder: No gallstones or wall thickening visualized. No sonographic Murphy sign noted by sonographer. Common bile  duct: Diameter: 6 mm Liver: Diffuse increased echogenicity with slightly heterogeneous liver. Appearance typically secondary to fatty infiltration. Fibrosis secondary consideration. No secondary findings of cirrhosis noted. No focal hepatic lesion or intrahepatic biliary duct dilatation. Portal vein is patent on color Doppler imaging with normal direction of blood flow towards the liver. Other: None. IMPRESSION: 1. No evidence for cholelithiasis or acute cholecystitis. 2. Hepatic steatosis. Electronically Signed   By: Constance Holster M.D.   On: 05/17/2020 20:00    Scheduled Meds: . enoxaparin (LOVENOX) injection  40 mg Subcutaneous Q24H  . ferrous sulfate  325 mg Oral Q breakfast  . folic acid  1 mg Oral Daily  . mometasone-formoterol  2 puff Inhalation BID    Continuous Infusions:   LOS: 0 days     Alma Friendly, MD [facility]  If 7PM-7AM, please contact night-coverage [email or web address] 05/18/2020, 2:16 PM

## 2020-05-19 DIAGNOSIS — J45909 Unspecified asthma, uncomplicated: Secondary | ICD-10-CM | POA: Diagnosis present

## 2020-05-19 DIAGNOSIS — D509 Iron deficiency anemia, unspecified: Secondary | ICD-10-CM | POA: Diagnosis present

## 2020-05-19 DIAGNOSIS — Z66 Do not resuscitate: Secondary | ICD-10-CM | POA: Diagnosis present

## 2020-05-19 DIAGNOSIS — K852 Alcohol induced acute pancreatitis without necrosis or infection: Secondary | ICD-10-CM | POA: Diagnosis present

## 2020-05-19 DIAGNOSIS — Z87891 Personal history of nicotine dependence: Secondary | ICD-10-CM | POA: Diagnosis not present

## 2020-05-19 DIAGNOSIS — K859 Acute pancreatitis without necrosis or infection, unspecified: Secondary | ICD-10-CM | POA: Diagnosis present

## 2020-05-19 DIAGNOSIS — Z20822 Contact with and (suspected) exposure to covid-19: Secondary | ICD-10-CM | POA: Diagnosis present

## 2020-05-19 DIAGNOSIS — E876 Hypokalemia: Secondary | ICD-10-CM | POA: Diagnosis present

## 2020-05-19 DIAGNOSIS — F1010 Alcohol abuse, uncomplicated: Secondary | ICD-10-CM | POA: Diagnosis present

## 2020-05-19 DIAGNOSIS — Z8249 Family history of ischemic heart disease and other diseases of the circulatory system: Secondary | ICD-10-CM | POA: Diagnosis not present

## 2020-05-19 DIAGNOSIS — K760 Fatty (change of) liver, not elsewhere classified: Secondary | ICD-10-CM | POA: Diagnosis present

## 2020-05-19 DIAGNOSIS — R400 Somnolence: Secondary | ICD-10-CM | POA: Diagnosis present

## 2020-05-19 DIAGNOSIS — I10 Essential (primary) hypertension: Secondary | ICD-10-CM | POA: Diagnosis present

## 2020-05-19 DIAGNOSIS — R109 Unspecified abdominal pain: Secondary | ICD-10-CM | POA: Diagnosis present

## 2020-05-19 LAB — CBC WITH DIFFERENTIAL/PLATELET
Abs Immature Granulocytes: 0.05 10*3/uL (ref 0.00–0.07)
Basophils Absolute: 0 10*3/uL (ref 0.0–0.1)
Basophils Relative: 0 %
Eosinophils Absolute: 0.3 10*3/uL (ref 0.0–0.5)
Eosinophils Relative: 3 %
HCT: 25.5 % — ABNORMAL LOW (ref 36.0–46.0)
Hemoglobin: 8.2 g/dL — ABNORMAL LOW (ref 12.0–15.0)
Immature Granulocytes: 1 %
Lymphocytes Relative: 10 %
Lymphs Abs: 1 10*3/uL (ref 0.7–4.0)
MCH: 27.3 pg (ref 26.0–34.0)
MCHC: 32.2 g/dL (ref 30.0–36.0)
MCV: 85 fL (ref 80.0–100.0)
Monocytes Absolute: 1.4 10*3/uL — ABNORMAL HIGH (ref 0.1–1.0)
Monocytes Relative: 14 %
Neutro Abs: 7.1 10*3/uL (ref 1.7–7.7)
Neutrophils Relative %: 72 %
Platelets: 562 10*3/uL — ABNORMAL HIGH (ref 150–400)
RBC: 3 MIL/uL — ABNORMAL LOW (ref 3.87–5.11)
RDW: 20.7 % — ABNORMAL HIGH (ref 11.5–15.5)
WBC: 9.9 10*3/uL (ref 4.0–10.5)
nRBC: 0 % (ref 0.0–0.2)

## 2020-05-19 LAB — BASIC METABOLIC PANEL
Anion gap: 7 (ref 5–15)
BUN: 7 mg/dL — ABNORMAL LOW (ref 8–23)
CO2: 24 mmol/L (ref 22–32)
Calcium: 10.2 mg/dL (ref 8.9–10.3)
Chloride: 108 mmol/L (ref 98–111)
Creatinine, Ser: 0.56 mg/dL (ref 0.44–1.00)
GFR calc Af Amer: >60 mL/min (ref >60)
GFR calc non Af Amer: >60 mL/min (ref >60)
Glucose, Bld: 93 mg/dL (ref 70–99)
Potassium: 4 mmol/L (ref 3.5–5.1)
Sodium: 139 mmol/L (ref 135–145)

## 2020-05-19 LAB — LIPASE, BLOOD: Lipase: 100 U/L — ABNORMAL HIGH (ref 11–51)

## 2020-05-19 MED ORDER — LORAZEPAM 2 MG PO TABS: 0.0000 mg | ORAL_TABLET | Freq: Two times a day (BID) | ORAL | Status: DC

## 2020-05-19 MED ORDER — POLYVINYL ALCOHOL 1.4 % OP SOLN
1.0000 [drp] | OPHTHALMIC | Status: DC | PRN
  Filled 2020-05-19: qty 15

## 2020-05-19 MED ORDER — LORAZEPAM 2 MG PO TABS
0.0000 mg | ORAL_TABLET | Freq: Four times a day (QID) | ORAL | Status: AC
  Administered 2020-05-19 – 2020-05-20 (×2): 2 mg via ORAL
  Filled 2020-05-19 (×2): qty 1

## 2020-05-19 NOTE — Progress Notes (Signed)
PROGRESS NOTE  Linda Mcgee MPN:[id number] DOB: [birth year] DOA: 05/17/2020 PCP: [name], MD  HPI/Recap of past 24 hours: HPI from Dr Berline Chough Gosse is a 64 y.o. female with medical history significant for hypertension, asthma, anemia, who presents to the ED for evaluation of abdominal pain.  Patient reports new onset of epigastric pain beginning about 1 week ago.  She was seen by her PCP with initial lab work showing anemia with hemoglobin 7.5.  She was sent to the ED for evaluation and was transfused with 1 unit PRBC before discharge to home.  Further outpatient lab work returned with elevated lipase of 1150.  She was having mild symptoms therefore she was treated conservatively on outpatient basis.  She had been advised to maintain a liquid diet which she reports adherence to. Pt returned to see her PCP on 05/17/2020 and was noted to have elevated WBC to 12.4 with lipase 466. Patient states that she is having some continued abdominal pain, nausea and emesis. Pt does report significant alcohol use with 1 bottle of wine each day Tuesday through Sunday. In the ED, initial vitals showed BP 137/94, pulse 97, RR 16, temp 98.4 Fahrenheit, SPO2 100% on room air. Labs are notable for lipase 275, WBC 11.4, hemoglobin 9.7, urinalysis showed negative nitrites, moderate leukocytes, 21-50 WBC/hpf, rare bacteria microscopy. CT abdomen/pelvis with contrast showed mild to moderate severity acute pancreatitis, hepatic steatosis.  Patient admitted for further management.    Today, patient still reports mild abdominal discomfort mostly after eating, advised patient to eat as much as she tolerates and back off if worsening abdominal pain.  Denies any further nausea/vomiting, denies any fever/chills, chest pain, shortness of breath.    Assessment/Plan: Principal Problem:   Acute pancreatitis Active Problems:   Hypertension   Asthma   Anemia   Hypokalemia   Pancreatitis  Acute  pancreatitis Likely 2/2 alcohol use, less likely due to HCTZ use  Lipase trending down CT abdomen/pelvis showed mild to moderate acute pancreatitis, hepatic steatosis, no evidence of gallstones/gallbladder wall thickening or biliary dilatation Right upper quadrant showed hepatic steatosis Continue IV fluids, antiemetics, pain control Advance diet as tolerated, currently on full liquid diet Monitor closely  Leukocytosis Resolved Currently afebrile, likely reactive UA showed moderate leukocytes, 21-50 WBC, rare bacteria UC showed multiple species Monitor closely, daily CBC  Iron deficiency anemia Recent labs showed iron 14, TIBC 383, saturation 4%, ferritin 23, folate 4.8 Continue iron and folate supplement  Hypertension Continue to hold home losartan/HCTZ for now  Asthma Continue Dulera and as needed albuterol  Alcohol abuse Advised to quit CIWA protocol        Malnutrition Type:      Malnutrition Characteristics:      Nutrition Interventions:       Estimated body mass index is 28.37 kg/m as calculated from the following:   Height as of this encounter: 5' (1.524 m).   Weight as of this encounter: 65.9 kg.     Code Status: DNR  Family Communication: Discussed with patient  Disposition Plan: Status is: Inpatient  The patient will require care spanning > 2 midnights and should be moved to inpatient because: Inpatient level of care appropriate due to severity of illness  Dispo: The patient is from: Home              Anticipated d/c is to: Home              Anticipated d/c date is: 1 day  Patient currently is not medically stable to d/c.    Consultants:  None  Procedures:  None  Antimicrobials:  None  DVT prophylaxis: Lovenox   Objective: Vitals:   05/18/20 1203 05/18/20 2018 05/19/20 0340 05/19/20 1155  BP: 136/79 (!) 154/84 (!) 144/85 (!) 143/73  Pulse: 86 84 75 74  Resp:   18   Temp: 98 F (36.7 C) 98.8 F (37.1  C) 98.8 F (37.1 C) 97.6 F (36.4 C)  TempSrc:  Oral Oral Oral  SpO2: 100% 100% 100% 100%  Weight:      Height:        Intake/Output Summary (Last 24 hours) at 05/19/2020 1736 Last data filed at 05/19/2020 1427 Gross per 24 hour  Intake 2291.71 ml  Output 600 ml  Net 1691.71 ml   Filed Weights   05/17/20 1256 05/17/20 2242  Weight: 65.3 kg 65.9 kg    Exam:  General: NAD   Cardiovascular: S1, S2 present  Respiratory: CTAB  Abdomen: Soft, nontender, nondistended, bowel sounds present  Musculoskeletal: No bilateral pedal edema noted  Skin: Normal  Psychiatry: Normal mood   Data Reviewed: CBC: Recent Labs  Lab 05/17/20 1302 05/18/20 0509 05/19/20 0542  WBC 11.4* 12.8* 9.9  NEUTROABS  --   --  7.1  HGB 9.7* 8.5* 8.2*  HCT 30.8* 26.0* 25.5*  MCV 86.8 83.9 85.0  PLT 620* 558* 160*   Basic Metabolic Panel: Recent Labs  Lab 05/17/20 1302 05/18/20 0509 05/19/20 0542  NA 137 135 139  K 3.1* 3.8 4.0  CL 108 106 108  CO2 20* 21* 24  GLUCOSE 87 69* 93  BUN 13 8 7*  CREATININE 0.75 0.61 0.56  CALCIUM 11.2* 10.7* 10.2  MG 1.7 1.7  --    GFR: Estimated Creatinine Clearance: 61 mL/min (by C-G formula based on SCr of 0.56 mg/dL). Liver Function Tests: Recent Labs  Lab 05/17/20 1302 05/18/20 0509  AST 45* 27  ALT 44 33  ALKPHOS 89 72  BILITOT 0.8 0.8  PROT 8.0 6.3*  ALBUMIN 3.6 2.8*   Recent Labs  Lab 05/17/20 1302 05/18/20 0509 05/19/20 0542  LIPASE 275* 103* 100*   No results for input(s): AMMONIA in the last 168 hours. Coagulation Profile: No results for input(s): INR, PROTIME in the last 168 hours. Cardiac Enzymes: No results for input(s): CKTOTAL, CKMB, CKMBINDEX, TROPONINI in the last 168 hours. BNP (last 3 results) No results for input(s): PROBNP in the last 8760 hours. HbA1C: No results for input(s): HGBA1C in the last 72 hours. CBG: No results for input(s): GLUCAP in the last 168 hours. Lipid Profile: No results for input(s):  CHOL, HDL, LDLCALC, TRIG, CHOLHDL, LDLDIRECT in the last 72 hours. Thyroid Function Tests: No results for input(s): TSH, T4TOTAL, FREET4, T3FREE, THYROIDAB in the last 72 hours. Anemia Panel: No results for input(s): VITAMINB12, FOLATE, FERRITIN, TIBC, IRON, RETICCTPCT in the last 72 hours. Urine analysis:    Component Value Date/Time   COLORURINE YELLOW (A) 05/17/2020 1302   APPEARANCEUR CLOUDY (A) 05/17/2020 1302   LABSPEC 1.019 05/17/2020 1302   PHURINE 6.0 05/17/2020 1302   GLUCOSEU NEGATIVE 05/17/2020 1302   HGBUR NEGATIVE 05/17/2020 1302   BILIRUBINUR NEGATIVE 05/17/2020 1302   KETONESUR 20 (A) 05/17/2020 1302   PROTEINUR 30 (A) 05/17/2020 1302   NITRITE NEGATIVE 05/17/2020 1302   LEUKOCYTESUR MODERATE (A) 05/17/2020 1302   Sepsis Labs: @LABRCNTIP (procalcitonin:4,lacticidven:4)  ) Recent Results (from the past 240 hour(s))  Urine culture     Status:  Abnormal   Collection Time: 05/17/20  1:02 PM   Specimen: Urine, Random  Result Value Ref Range Status   Specimen Description   Final    URINE, RANDOM Performed at [facility], [address], [place], [place] [address]    Special Requests   Final    NONE Performed at [facility], [address] [place], [place] [address]    Culture MULTIPLE SPECIES PRESENT, SUGGEST RECOLLECTION (A)  Final   Report Status 05/18/2020 FINAL  Final  SARS Coronavirus 2 by RT PCR (hospital order, performed in [facility] hospital lab) Nasopharyngeal Nasopharyngeal Swab     Status: None   Collection Time: 05/17/20  6:40 PM   Specimen: Nasopharyngeal Swab  Result Value Ref Range Status   SARS Coronavirus 2 NEGATIVE NEGATIVE Final    Comment: (NOTE) SARS-CoV-2 target nucleic acids are NOT DETECTED.  The SARS-CoV-2 RNA is generally detectable in upper and lower respiratory specimens during the acute phase of infection. The lowest concentration of SARS-CoV-2 viral copies this assay can detect is 250 copies / mL. A  negative result does not preclude SARS-CoV-2 infection and should not be used as the sole basis for treatment or other patient management decisions.  A negative result may occur with improper specimen collection / handling, submission of specimen other than nasopharyngeal swab, presence of viral mutation(s) within the areas targeted by this assay, and inadequate number of viral copies (<250 copies / mL). A negative result must be combined with clinical observations, patient history, and epidemiological information.  Fact Sheet for Patients:   StrictlyIdeas.no  Fact Sheet for Healthcare Providers: BankingDealers.co.za  This test is not yet approved or  cleared by the [place] FDA and has been authorized for detection and/or diagnosis of SARS-CoV-2 by FDA under an Emergency Use Authorization (EUA).  This EUA will remain in effect (meaning this test can be used) for the duration of the COVID-19 declaration under Section 564(b)(1) of the Act, 21 U.S.C. section 360bbb-3(b)(1), unless the authorization is terminated or revoked sooner.  Performed at [facility], [address] [place], [place] [address]       Studies: No results found.  Scheduled Meds: . enoxaparin (LOVENOX) injection  40 mg Subcutaneous Q24H  . ferrous sulfate  325 mg Oral Q breakfast  . folic acid  1 mg Oral Daily  . LORazepam  0-4 mg Oral Q6H   Followed by  . [START ON 05/20/2020] LORazepam  0-4 mg Oral Q12H  . mometasone-formoterol  2 puff Inhalation BID  . multivitamin with minerals  1 tablet Oral Daily  . thiamine  100 mg Oral Daily   Or  . thiamine  100 mg Intravenous Daily    Continuous Infusions: . sodium chloride 100 mL/hr at 05/19/20 1555     LOS: 0 days     Alma Friendly, MD [facility]  If 7PM-7AM, please contact night-coverage [email or web address] 05/19/2020, 5:36 PM

## 2020-05-20 DIAGNOSIS — J45909 Unspecified asthma, uncomplicated: Secondary | ICD-10-CM

## 2020-05-20 DIAGNOSIS — E876 Hypokalemia: Secondary | ICD-10-CM

## 2020-05-20 LAB — BASIC METABOLIC PANEL
Anion gap: 6 (ref 5–15)
BUN: <5 mg/dL — ABNORMAL LOW (ref 8–23)
CO2: 25 mmol/L (ref 22–32)
Calcium: 10.3 mg/dL (ref 8.9–10.3)
Chloride: 107 mmol/L (ref 98–111)
Creatinine, Ser: 0.62 mg/dL (ref 0.44–1.00)
GFR calc Af Amer: >60 mL/min (ref >60)
GFR calc non Af Amer: >60 mL/min (ref >60)
Glucose, Bld: 109 mg/dL — ABNORMAL HIGH (ref 70–99)
Potassium: 3.6 mmol/L (ref 3.5–5.1)
Sodium: 138 mmol/L (ref 135–145)

## 2020-05-20 LAB — CBC WITH DIFFERENTIAL/PLATELET
Abs Immature Granulocytes: 0.04 10*3/uL (ref 0.00–0.07)
Basophils Absolute: 0 10*3/uL (ref 0.0–0.1)
Basophils Relative: 0 %
Eosinophils Absolute: 0.4 10*3/uL (ref 0.0–0.5)
Eosinophils Relative: 4 %
HCT: 25.3 % — ABNORMAL LOW (ref 36.0–46.0)
Hemoglobin: 7.8 g/dL — ABNORMAL LOW (ref 12.0–15.0)
Immature Granulocytes: 1 %
Lymphocytes Relative: 10 %
Lymphs Abs: 0.9 10*3/uL (ref 0.7–4.0)
MCH: 27 pg (ref 26.0–34.0)
MCHC: 30.8 g/dL (ref 30.0–36.0)
MCV: 87.5 fL (ref 80.0–100.0)
Monocytes Absolute: 1 10*3/uL (ref 0.1–1.0)
Monocytes Relative: 12 %
Neutro Abs: 6 10*3/uL (ref 1.7–7.7)
Neutrophils Relative %: 73 %
Platelets: 603 10*3/uL — ABNORMAL HIGH (ref 150–400)
RBC: 2.89 MIL/uL — ABNORMAL LOW (ref 3.87–5.11)
RDW: 20.5 % — ABNORMAL HIGH (ref 11.5–15.5)
WBC: 8.3 10*3/uL (ref 4.0–10.5)
nRBC: 0 % (ref 0.0–0.2)

## 2020-05-20 LAB — LIPASE, BLOOD: Lipase: 70 U/L — ABNORMAL HIGH (ref 11–51)

## 2020-05-20 MED ORDER — DOCUSATE SODIUM 100 MG PO TABS: 100.0000 mg | ORAL_TABLET | Freq: Two times a day (BID) | ORAL | 0 refills | Status: AC | PRN

## 2020-05-20 MED ORDER — LOSARTAN POTASSIUM 100 MG PO TABS: 100.0000 mg | ORAL_TABLET | Freq: Every day | ORAL | 0 refills | Status: DC

## 2020-05-20 MED ORDER — LOSARTAN POTASSIUM 50 MG PO TABS
100.0000 mg | ORAL_TABLET | Freq: Every day | ORAL | Status: DC
  Administered 2020-05-20: 100 mg via ORAL
  Filled 2020-05-20: qty 2

## 2020-05-20 MED ORDER — FOLIC ACID 1 MG PO TABS: 1.0000 mg | ORAL_TABLET | Freq: Every day | ORAL | 0 refills | Status: AC

## 2020-05-20 MED ORDER — FERROUS SULFATE 325 (65 FE) MG PO TABS: 325.0000 mg | ORAL_TABLET | Freq: Every day | ORAL | 0 refills | Status: DC

## 2020-05-20 MED ORDER — OXYCODONE HCL 5 MG PO TABS: 5.0000 mg | ORAL_TABLET | Freq: Three times a day (TID) | ORAL | 0 refills | Status: AC | PRN

## 2020-05-20 NOTE — Discharge Summary (Signed)
Discharge Summary  Linda Mcgee IAX:[id number] DOB: January 05, 1956  PCP: [name], MD  Admit date: 05/17/2020 Discharge date: 05/20/2020  Time spent: 40 mins  Recommendations for Outpatient Follow-up:  1. Follow up with PCP in 1 week  Discharge Diagnoses:  Active Hospital Problems   Diagnosis Date Noted  . Acute pancreatitis 05/17/2020  . Pancreatitis 05/19/2020  . Hypertension   . Asthma   . Anemia   . Hypokalemia     Resolved Hospital Problems  No resolved problems to display.    Discharge Condition: Stable  Diet recommendation: Advance diet as tolerated  Vitals:   05/20/20 0507 05/20/20 1220  BP: (!) 161/82 (!) 149/74  Pulse: 70 76  Resp: 16 20  Temp: 98.4 F (36.9 C) 97.8 F (36.6 C)  SpO2: 100% 100%    History of present illness:  Linda Cieslik Littleis a 64 y.o.femalewith medical history significant forhypertension, asthma, anemia, who presents to the ED for evaluation of abdominal pain.Patient reports new onset of epigastric pain beginning about 1 week ago. She was seen by her PCP with initial lab work showing anemia with hemoglobin 7.5. She was sent to the ED for evaluation and was transfused with 1 unit PRBC before discharge to home. Further outpatient lab work returned with elevated lipase of 1150. She was having mild symptoms therefore she was treated conservatively on outpatient basis. She had been advised to maintain a liquid diet which she reports adherence to. Pt returned to see her PCP on 05/17/2020 and was noted to haveelevated WBC to 12.4 with lipase 466. Patient states that she is having some continued abdominal pain, nausea and emesis. Pt does report significant alcohol use with 1 bottle of wine each day Tuesday through Sunday. In the ED, initial vitals showed BP 137/94, pulse 97, RR 16, temp 98.4 Fahrenheit, SPO2 100% on room air. Labs are notable for lipase 275, WBC 11.4, hemoglobin 9.7, urinalysis showed negative nitrites, moderate  leukocytes, 21-50 WBC/hpf, rare bacteria microscopy. CT abdomen/pelvis with contrast showed mild to moderate severity acute pancreatitis, hepatic steatosis.  Patient admitted for further management.    Today, patient was given Ativan (unsure why it was given) as well as Oxy around 5 AM, noted to be drowsy this a.m.  Otherwise currently denies any new complaints, was able to tolerate a soft diet for lunch.  Patient advised to follow-up with PCP in 1 week with repeat labs and outpatient work-up for iron deficiency anemia    Hospital Course:  Principal Problem:   Acute pancreatitis Active Problems:   Hypertension   Asthma   Anemia   Hypokalemia   Pancreatitis   Acute pancreatitis Likely 2/2 alcohol use, also on HCTZ use (known to cause pancreatitis) Lipase trended down CT abdomen/pelvis showed mild to moderate acute pancreatitis, hepatic steatosis, no evidence of gallstones/gallbladder wall thickening or biliary dilatation Right upper quadrant showed hepatic steatosis S/p IV fluids, antiemetics, pain control Advance diet as tolerated Follow up with PCP  Leukocytosis Resolved Currently afebrile, likely reactive UA showed moderate leukocytes, 21-50 WBC, rare bacteria UC showed multiple species  Iron deficiency anemia Recent labs showed iron 14, TIBC 383, saturation 4%, ferritin 23, folate 4.8 Continue iron and folate supplement Follow-up with PCP for further anemia work-up, may need outpatient GI work-up  Hypertension Restart home losartan, advised to hold HCTZ for now PCP to reevaluate  Asthma Continue Dulera and as needed albuterol  Alcohol abuse Advised to quit          Malnutrition Type:  Malnutrition Characteristics:      Nutrition Interventions:      Estimated body mass index is 28.37 kg/m as calculated from the following:   Height as of this encounter: 5' (1.524 m).   Weight as of this encounter: 65.9 kg.     Procedures:  None  Consultations:  None  Discharge Exam: BP (!) 149/74 (BP Location: Left Arm)   Pulse 76   Temp 97.8 F (36.6 C) (Oral)   Resp 20   Ht 5' (1.524 m)   Wt 65.9 kg   SpO2 100%   BMI 28.37 kg/m   General: NAD Cardiovascular: S1, S2 present Respiratory: CTAB Abdomen: Soft, nontender, nondistended, bowel sounds present    Discharge Instructions You were cared for by a hospitalist during your hospital stay. If you have any questions about your discharge medications or the care you received while you were in the hospital after you are discharged, you can call the unit and asked to speak with the hospitalist on call if the hospitalist that took care of you is not available. Once you are discharged, your primary care physician will handle any further medical issues. Please note that NO REFILLS for any discharge medications will be authorized once you are discharged, as it is imperative that you return to your primary care physician (or establish a relationship with a primary care physician if you do not have one) for your aftercare needs so that they can reassess your need for medications and monitor your lab values.  Discharge Instructions    Diet - low sodium heart healthy   Complete by: As directed    Advance diet as tolerated, soft easy to digest   Increase activity slowly   Complete by: As directed      Allergies as of 05/20/2020      Reactions   Lisinopril-hydrochlorothiazide Cough   Patient states affected by Lisinopril not HCTZ      Medication List    STOP taking these medications   losartan-hydrochlorothiazide 100-25 MG tablet Commonly known as: HYZAAR     TAKE these medications   Advair Diskus 250-50 MCG/DOSE Aepb Generic drug: Fluticasone-Salmeterol Inhale 1 puff into the lungs every 12 (twelve) hours.   albuterol 108 (90 Base) MCG/ACT inhaler Commonly known as: VENTOLIN HFA Inhale 2 puffs into the lungs every 4 (four) hours as needed  for wheezing or shortness of breath.   Docusate Sodium 100 MG capsule Take 1 tablet (100 mg total) by mouth 2 (two) times daily as needed for constipation.   ferrous sulfate 325 (65 FE) MG tablet Take 1 tablet (325 mg total) by mouth daily with breakfast. Start taking on: May 21, 9146   folic acid 1 MG tablet Commonly known as: FOLVITE Take 1 tablet (1 mg total) by mouth daily. Start taking on: May 21, 2020   losartan 100 MG tablet Commonly known as: COZAAR Take 1 tablet (100 mg total) by mouth daily. Start taking on: May 21, 2020   oxyCODONE 5 MG immediate release tablet Commonly known as: Oxy IR/ROXICODONE Take 1 tablet (5 mg total) by mouth every 8 (eight) hours as needed for up to 3 days for moderate pain or breakthrough pain.   triamcinolone 55 MCG/ACT Aero nasal inhaler Commonly known as: NASACORT Place 2 sprays into the nose daily.      Allergies  Allergen Reactions  . Lisinopril-Hydrochlorothiazide Cough    Patient states affected by Lisinopril not HCTZ    Follow-up Information    [name],  MD. Schedule an appointment as soon as possible for a visit in 1 week(s).   Specialty: Family Medicine Contact information: [address] [place] [address] [phone]                The results of significant diagnostics from this hospitalization (including imaging, microbiology, ancillary and laboratory) are listed below for reference.    Significant Diagnostic Studies: CT Abdomen Pelvis W Contrast  Result Date: 05/17/2020 CLINICAL DATA:  Abdominal pain. EXAM: CT ABDOMEN AND PELVIS WITH CONTRAST TECHNIQUE: Multidetector CT imaging of the abdomen and pelvis was performed using the standard protocol following bolus administration of intravenous contrast. CONTRAST:  178mL OMNIPAQUE IOHEXOL 300 MG/ML  SOLN COMPARISON:  July 01, 2011 FINDINGS: Lower chest: No acute abnormality. Hepatobiliary: There is diffuse fatty infiltration  of the liver parenchyma. No focal liver abnormality is seen. No gallstones, gallbladder wall thickening, or biliary dilatation. Pancreas: There is mild to moderate severity peripancreatic inflammatory fat stranding. Spleen: Normal in size without focal abnormality. Adrenals/Urinary Tract: Adrenal glands are unremarkable. Kidneys are normal, without renal calculi, focal lesion, or hydronephrosis. Bladder is unremarkable. Stomach/Bowel: Stomach is within normal limits. Appendix appears normal. No evidence of bowel wall thickening, distention, or inflammatory changes. Vascular/Lymphatic: There is mild calcification of the abdominal aorta without evidence of aneurysmal dilatation. No enlarged abdominal or pelvic lymph nodes. Reproductive: Multiple heterogeneous fibroids are seen within the uterine fundus. The largest measures approximately 2.6 cm x 1.5 cm. The bilateral adnexa are unremarkable. Other: No abdominal wall hernia or abnormality. No abdominopelvic ascites. Musculoskeletal: No acute or significant osseous findings. IMPRESSION: 1. Mild to moderate severity acute pancreatitis. 2. Hepatic steatosis. 3. Uterine fibroids. 4. Aortic atherosclerosis. Aortic Atherosclerosis (ICD10-I70.0). Electronically Signed   By: Virgina Norfolk M.D.   On: 05/17/2020 18:06   US Abdomen Limited RUQ  Result Date: 05/17/2020 CLINICAL DATA:  Intermittent abdominal pain EXAM: ULTRASOUND ABDOMEN LIMITED RIGHT UPPER QUADRANT COMPARISON:  CT dated 05/17/2020 FINDINGS: Gallbladder: No gallstones or wall thickening visualized. No sonographic Murphy sign noted by sonographer. Common bile duct: Diameter: 6 mm Liver: Diffuse increased echogenicity with slightly heterogeneous liver. Appearance typically secondary to fatty infiltration. Fibrosis secondary consideration. No secondary findings of cirrhosis noted. No focal hepatic lesion or intrahepatic biliary duct dilatation. Portal vein is patent on color Doppler imaging with normal  direction of blood flow towards the liver. Other: None. IMPRESSION: 1. No evidence for cholelithiasis or acute cholecystitis. 2. Hepatic steatosis. Electronically Signed   By: Constance Holster M.D.   On: 05/17/2020 20:00    Microbiology: Recent Results (from the past 240 hour(s))  Urine culture     Status: Abnormal   Collection Time: 05/17/20  1:02 PM   Specimen: Urine, Random  Result Value Ref Range Status   Specimen Description   Final    URINE, RANDOM Performed at [facility], [address] [place], [place] [address]    Special Requests   Final    NONE Performed at [facility], [address] [place], [place] [address]    Culture MULTIPLE SPECIES PRESENT, SUGGEST RECOLLECTION (A)  Final   Report Status 05/18/2020 FINAL  Final  SARS Coronavirus 2 by RT PCR (hospital order, performed in [facility] hospital lab) Nasopharyngeal Nasopharyngeal Swab     Status: None   Collection Time: 05/17/20  6:40 PM   Specimen: Nasopharyngeal Swab  Result Value Ref Range Status   SARS Coronavirus 2 NEGATIVE NEGATIVE Final    Comment: (  NOTE) SARS-CoV-2 target nucleic acids are NOT DETECTED.  The SARS-CoV-2 RNA is generally detectable in upper and lower respiratory specimens during the acute phase of infection. The lowest concentration of SARS-CoV-2 viral copies this assay can detect is 250 copies / mL. A negative result does not preclude SARS-CoV-2 infection and should not be used as the sole basis for treatment or other patient management decisions.  A negative result may occur with improper specimen collection / handling, submission of specimen other than nasopharyngeal swab, presence of viral mutation(s) within the areas targeted by this assay, and inadequate number of viral copies (<250 copies / mL). A negative result must be combined with clinical observations, patient history, and epidemiological information.  Fact Sheet for Patients:    StrictlyIdeas.no  Fact Sheet for Healthcare Providers: BankingDealers.co.za  This test is not yet approved or  cleared by the [place] FDA and has been authorized for detection and/or diagnosis of SARS-CoV-2 by FDA under an Emergency Use Authorization (EUA).  This EUA will remain in effect (meaning this test can be used) for the duration of the COVID-19 declaration under Section 564(b)(1) of the Act, 21 U.S.C. section 360bbb-3(b)(1), unless the authorization is terminated or revoked sooner.  Performed at [facility], [address] [place], [place] [address]      Labs: Basic Metabolic Panel: Recent Labs  Lab 05/17/20 1302 05/18/20 0509 05/19/20 0542 05/20/20 0743  NA 137 135 139 138  K 3.1* 3.8 4.0 3.6  CL 108 106 108 107  CO2 20* 21* 24 25  GLUCOSE 87 69* 93 109*  BUN 13 8 7* <5*  CREATININE 0.75 0.61 0.56 0.62  CALCIUM 11.2* 10.7* 10.2 10.3  MG 1.7 1.7  --   --    Liver Function Tests: Recent Labs  Lab 05/17/20 1302 05/18/20 0509  AST 45* 27  ALT 44 33  ALKPHOS 89 72  BILITOT 0.8 0.8  PROT 8.0 6.3*  ALBUMIN 3.6 2.8*   Recent Labs  Lab 05/17/20 1302 05/18/20 0509 05/19/20 0542 05/20/20 0743  LIPASE 275* 103* 100* 70*   No results for input(s): AMMONIA in the last 168 hours. CBC: Recent Labs  Lab 05/17/20 1302 05/18/20 0509 05/19/20 0542 05/20/20 0743  WBC 11.4* 12.8* 9.9 8.3  NEUTROABS  --   --  7.1 6.0  HGB 9.7* 8.5* 8.2* 7.8*  HCT 30.8* 26.0* 25.5* 25.3*  MCV 86.8 83.9 85.0 87.5  PLT 620* 558* 562* 603*   Cardiac Enzymes: No results for input(s): CKTOTAL, CKMB, CKMBINDEX, TROPONINI in the last 168 hours. BNP: BNP (last 3 results) No results for input(s): BNP in the last 8760 hours.  ProBNP (last 3 results) No results for input(s): PROBNP in the last 8760 hours.  CBG: No results for input(s): GLUCAP in the last 168 hours.     Signed:  Alma Friendly,  MD [facility] 05/20/2020, 5:24 PM

## 2020-05-20 NOTE — Progress Notes (Signed)
Linda Mcgee to be D/C'd home per MD order.  Discussed prescriptions and follow up appointments with the patient. Prescriptions given to patient, medication list explained in detail. Pt verbalized understanding.  Allergies as of 05/20/2020       Reactions   Lisinopril-hydrochlorothiazide Cough   Patient states affected by Lisinopril not HCTZ        Medication List     STOP taking these medications    losartan-hydrochlorothiazide 100-25 MG tablet Commonly known as: HYZAAR       TAKE these medications    Advair Diskus 250-50 MCG/DOSE Aepb Generic drug: Fluticasone-Salmeterol Inhale 1 puff into the lungs every 12 (twelve) hours.   albuterol 108 (90 Base) MCG/ACT inhaler Commonly known as: VENTOLIN HFA Inhale 2 puffs into the lungs every 4 (four) hours as needed for wheezing or shortness of breath.   Docusate Sodium 100 MG capsule Take 1 tablet (100 mg total) by mouth 2 (two) times daily as needed for constipation.   ferrous sulfate 325 (65 FE) MG tablet Take 1 tablet (325 mg total) by mouth daily with breakfast. Start taking on: May 21, 4733   folic acid 1 MG tablet Commonly known as: FOLVITE Take 1 tablet (1 mg total) by mouth daily. Start taking on: May 21, 2020   losartan 100 MG tablet Commonly known as: COZAAR Take 1 tablet (100 mg total) by mouth daily. Start taking on: May 21, 2020   oxyCODONE 5 MG immediate release tablet Commonly known as: Oxy IR/ROXICODONE Take 1 tablet (5 mg total) by mouth every 8 (eight) hours as needed for up to 3 days for moderate pain or breakthrough pain.   triamcinolone 55 MCG/ACT Aero nasal inhaler Commonly known as: NASACORT Place 2 sprays into the nose daily.        Vitals:   05/20/20 0507 05/20/20 1220  BP: (!) 161/82 (!) 149/74  Pulse: 70 76  Resp: 16 20  Temp: 98.4 F (36.9 C) 97.8 F (36.6 C)  SpO2: 100% 100%    Skin clean, dry and intact without evidence of skin break down, no evidence of skin  tears noted. IV catheter discontinued intact. Site without signs and symptoms of complications. Dressing and pressure applied. Pt denies pain at this time. No complaints noted.  An After Visit Summary was printed and given to the patient. Patient escorted via [place], and D/C home via private auto.  Lometa A Payzlee Ryder

## 2020-06-16 ENCOUNTER — Other Ambulatory Visit: Payer: Self-pay | Admitting: Family Medicine

## 2020-06-16 DIAGNOSIS — Z1231 Encounter for screening mammogram for malignant neoplasm of breast: Secondary | ICD-10-CM

## 2020-07-11 ENCOUNTER — Ambulatory Visit
Admission: RE | Admit: 2020-07-11 | Discharge: 2020-07-11 | Disposition: A | Discharge status: Home / Self Care | Payer: BC Managed Care – PPO | Source: Ambulatory Visit | Attending: Family Medicine | Admitting: Family Medicine

## 2020-07-11 ENCOUNTER — Other Ambulatory Visit: Payer: Self-pay

## 2020-07-11 DIAGNOSIS — Z1231 Encounter for screening mammogram for malignant neoplasm of breast: Secondary | ICD-10-CM | POA: Diagnosis not present

## 2021-06-13 ENCOUNTER — Other Ambulatory Visit: Payer: Self-pay | Admitting: Family Medicine

## 2021-06-13 DIAGNOSIS — Z1231 Encounter for screening mammogram for malignant neoplasm of breast: Secondary | ICD-10-CM

## 2021-07-12 ENCOUNTER — Ambulatory Visit
Admission: RE | Admit: 2021-07-12 | Discharge: 2021-07-12 | Disposition: A | Discharge status: Home / Self Care | Payer: BC Managed Care – PPO | Source: Ambulatory Visit | Attending: Family Medicine | Admitting: Family Medicine

## 2021-07-12 ENCOUNTER — Other Ambulatory Visit: Payer: Self-pay

## 2021-07-12 DIAGNOSIS — Z1231 Encounter for screening mammogram for malignant neoplasm of breast: Secondary | ICD-10-CM | POA: Insufficient documentation

## 2021-07-19 ENCOUNTER — Other Ambulatory Visit: Payer: Self-pay | Admitting: Family Medicine

## 2021-07-19 DIAGNOSIS — N6489 Other specified disorders of breast: Secondary | ICD-10-CM

## 2021-07-19 DIAGNOSIS — R928 Other abnormal and inconclusive findings on diagnostic imaging of breast: Secondary | ICD-10-CM

## 2021-07-27 ENCOUNTER — Other Ambulatory Visit: Payer: BC Managed Care – PPO

## 2021-07-27 ENCOUNTER — Ambulatory Visit: Admission: RE | Admit: 2021-07-27 | Payer: BC Managed Care – PPO | Source: Ambulatory Visit

## 2021-09-11 ENCOUNTER — Other Ambulatory Visit: Payer: Self-pay | Admitting: Nurse Practitioner

## 2021-09-11 DIAGNOSIS — R112 Nausea with vomiting, unspecified: Secondary | ICD-10-CM

## 2021-09-11 DIAGNOSIS — R1013 Epigastric pain: Secondary | ICD-10-CM

## 2021-09-11 DIAGNOSIS — D509 Iron deficiency anemia, unspecified: Secondary | ICD-10-CM

## 2021-09-12 ENCOUNTER — Other Ambulatory Visit: Payer: Self-pay

## 2021-09-12 ENCOUNTER — Emergency Department
Admission: EM | Admit: 2021-09-12 | Discharge: 2021-09-12 | Disposition: A | Discharge status: Home / Self Care | Payer: BC Managed Care – PPO | Attending: Emergency Medicine | Admitting: Emergency Medicine

## 2021-09-12 ENCOUNTER — Ambulatory Visit
Admission: RE | Admit: 2021-09-12 | Discharge: 2021-09-12 | Disposition: A | Discharge status: Home / Self Care | Payer: BC Managed Care – PPO | Source: Ambulatory Visit | Attending: Nurse Practitioner | Admitting: Nurse Practitioner

## 2021-09-12 DIAGNOSIS — R109 Unspecified abdominal pain: Secondary | ICD-10-CM | POA: Diagnosis present

## 2021-09-12 DIAGNOSIS — R112 Nausea with vomiting, unspecified: Secondary | ICD-10-CM

## 2021-09-12 DIAGNOSIS — R1013 Epigastric pain: Secondary | ICD-10-CM | POA: Diagnosis not present

## 2021-09-12 DIAGNOSIS — K859 Acute pancreatitis without necrosis or infection, unspecified: Secondary | ICD-10-CM | POA: Diagnosis not present

## 2021-09-12 DIAGNOSIS — D509 Iron deficiency anemia, unspecified: Secondary | ICD-10-CM

## 2021-09-12 DIAGNOSIS — Z5321 Procedure and treatment not carried out due to patient leaving prior to being seen by health care provider: Secondary | ICD-10-CM | POA: Insufficient documentation

## 2021-09-12 LAB — POCT I-STAT CREATININE: Creatinine, Ser: 0.7 mg/dL (ref 0.44–1.00)

## 2021-09-12 MED ORDER — SODIUM CHLORIDE 0.9 % IV BOLUS: 1000.0000 mL | Freq: Once | INTRAVENOUS | Status: DC

## 2021-09-12 MED ORDER — IOHEXOL 300 MG/ML  SOLN
100.0000 mL | Freq: Once | INTRAMUSCULAR | Status: AC | PRN
  Administered 2021-09-12: 100 mL via INTRAVENOUS

## 2021-09-12 NOTE — ED Notes (Signed)
No answer when called several times from lobby 

## 2021-09-12 NOTE — ED Notes (Signed)
No answer when called several times from lobby, no answer when phone # listed in chart called

## 2021-09-12 NOTE — ED Provider Notes (Signed)
  Emergency Medicine Provider Triage Evaluation Note  Linda Mcgee , a 65 y.o.female,  was evaluated in triage.  Pt complains of acute pancreatitis.  Patient was sent here by her provider for evaluation of acute pancreatitis after receiving a CT scan earlier today which showed fat stranding.  Patient is in no pain at the time.  No nausea/vomiting or fever/chills.  Patient states that she is only here because she was told that she needed to receive IV fluids   Review of Systems  Positive: None Negative: Denies fever, chest pain, vomiting  Physical Exam   Vitals:   09/12/21 1456  BP: (!) 172/97  Pulse: 92  Resp: 19  Temp: 98.7 F (37.1 C)  SpO2: 100%   Gen:   Awake, no distress   Resp:  Normal effort  MSK:   Moves extremities without difficulty  Other:    Medical Decision Making  Given the patient's initial medical screening exam, the following diagnostic evaluation has been ordered. The patient will be placed in the appropriate treatment space, once one is available, to complete the evaluation and treatment. I have discussed the plan of care with the patient and I have advised the patient that an ED physician or mid-level practitioner will reevaluate their condition after the test results have been received, as the results may give them additional insight into the type of treatment they may need.    Diagnostics: CBC, BMP, lipase  Treatments: Saline lock, IV fluids.   Teodoro Spray, [place] 09/12/21 1510    Duffy Bruce, MD 09/12/21 [phone]

## 2021-09-12 NOTE — ED Triage Notes (Signed)
Pt here with abd pain. Pt was sent here from her provider for acute pancreatitis. Pt in NAD in triage.

## 2021-09-17 ENCOUNTER — Ambulatory Visit: Admission: RE | Admit: 2021-09-17 | Payer: BC Managed Care – PPO | Source: Home / Self Care

## 2021-09-17 ENCOUNTER — Encounter: Admission: RE | Payer: Self-pay | Source: Home / Self Care

## 2021-09-17 SURGERY — COLONOSCOPY WITH PROPOFOL
Anesthesia: General

## 2021-10-16 ENCOUNTER — Other Ambulatory Visit: Payer: Self-pay | Admitting: Nurse Practitioner

## 2021-10-16 DIAGNOSIS — K760 Fatty (change of) liver, not elsewhere classified: Secondary | ICD-10-CM

## 2021-10-16 DIAGNOSIS — Z8719 Personal history of other diseases of the digestive system: Secondary | ICD-10-CM

## 2021-10-26 ENCOUNTER — Other Ambulatory Visit: Payer: Self-pay | Admitting: *Deleted

## 2021-10-26 ENCOUNTER — Encounter: Payer: Self-pay | Admitting: *Deleted

## 2021-10-29 ENCOUNTER — Ambulatory Visit: Payer: BC Managed Care – PPO

## 2021-10-30 ENCOUNTER — Ambulatory Visit: Payer: BC Managed Care – PPO

## 2021-11-01 ENCOUNTER — Encounter: Payer: Self-pay | Admitting: Oncology

## 2021-11-01 ENCOUNTER — Inpatient Hospital Stay: Payer: BC Managed Care – PPO

## 2021-11-01 ENCOUNTER — Other Ambulatory Visit: Payer: Self-pay

## 2021-11-01 ENCOUNTER — Inpatient Hospital Stay: Payer: BC Managed Care – PPO | Attending: Oncology | Admitting: Oncology

## 2021-11-01 VITALS — BP 138/66 | HR 67 | Temp 97.8°F | Resp 20 | Wt 133.3 lb

## 2021-11-01 DIAGNOSIS — R5383 Other fatigue: Secondary | ICD-10-CM

## 2021-11-01 DIAGNOSIS — D509 Iron deficiency anemia, unspecified: Secondary | ICD-10-CM

## 2021-11-01 DIAGNOSIS — R928 Other abnormal and inconclusive findings on diagnostic imaging of breast: Secondary | ICD-10-CM | POA: Diagnosis not present

## 2021-11-01 DIAGNOSIS — D500 Iron deficiency anemia secondary to blood loss (chronic): Secondary | ICD-10-CM

## 2021-11-01 DIAGNOSIS — F1090 Alcohol use, unspecified, uncomplicated: Secondary | ICD-10-CM

## 2021-11-01 DIAGNOSIS — Z87891 Personal history of nicotine dependence: Secondary | ICD-10-CM | POA: Diagnosis not present

## 2021-11-01 DIAGNOSIS — D649 Anemia, unspecified: Secondary | ICD-10-CM

## 2021-11-01 HISTORY — DX: Iron deficiency anemia, unspecified: D50.9

## 2021-11-01 LAB — RETIC PANEL
Immature Retic Fract: 33.2 % — ABNORMAL HIGH (ref 2.3–15.9)
RBC.: 2.52 MIL/uL — ABNORMAL LOW (ref 3.87–5.11)
Retic Count, Absolute: 105.8 10*3/uL (ref 19.0–186.0)
Retic Ct Pct: 4.2 % — ABNORMAL HIGH (ref 0.4–3.1)
Reticulocyte Hemoglobin: 24.4 pg — ABNORMAL LOW (ref >27.9)

## 2021-11-01 LAB — TECHNOLOGIST SMEAR REVIEW: Plt Morphology: INCREASED

## 2021-11-01 LAB — CBC WITH DIFFERENTIAL/PLATELET
Abs Immature Granulocytes: 0.03 10*3/uL (ref 0.00–0.07)
Basophils Absolute: 0 10*3/uL (ref 0.0–0.1)
Basophils Relative: 1 %
Eosinophils Absolute: 0.1 10*3/uL (ref 0.0–0.5)
Eosinophils Relative: 1 %
HCT: 21.3 % — ABNORMAL LOW (ref 36.0–46.0)
Hemoglobin: 6.2 g/dL — ABNORMAL LOW (ref 12.0–15.0)
Immature Granulocytes: 1 %
Lymphocytes Relative: 21 %
Lymphs Abs: 1.4 10*3/uL (ref 0.7–4.0)
MCH: 25.1 pg — ABNORMAL LOW (ref 26.0–34.0)
MCHC: 29.1 g/dL — ABNORMAL LOW (ref 30.0–36.0)
MCV: 86.2 fL (ref 80.0–100.0)
Monocytes Absolute: 0.7 10*3/uL (ref 0.1–1.0)
Monocytes Relative: 11 %
Neutro Abs: 4.4 10*3/uL (ref 1.7–7.7)
Neutrophils Relative %: 65 %
Platelets: 497 10*3/uL — ABNORMAL HIGH (ref 150–400)
RBC: 2.47 MIL/uL — ABNORMAL LOW (ref 3.87–5.11)
RDW: 22.5 % — ABNORMAL HIGH (ref 11.5–15.5)
WBC: 6.6 10*3/uL (ref 4.0–10.5)
nRBC: 0 % (ref 0.0–0.2)

## 2021-11-01 LAB — LACTATE DEHYDROGENASE: LDH: 132 U/L (ref 98–192)

## 2021-11-01 LAB — IRON AND TIBC
Iron: 14 ug/dL — ABNORMAL LOW (ref 28–170)
Saturation Ratios: 3 % — ABNORMAL LOW (ref 10.4–31.8)
TIBC: 465 ug/dL — ABNORMAL HIGH (ref 250–450)
UIBC: 451 ug/dL

## 2021-11-01 LAB — COMPREHENSIVE METABOLIC PANEL
ALT: 18 U/L (ref 0–44)
AST: 15 U/L (ref 15–41)
Albumin: 4.2 g/dL (ref 3.5–5.0)
Alkaline Phosphatase: 51 U/L (ref 38–126)
Anion gap: 8 (ref 5–15)
BUN: 14 mg/dL (ref 8–23)
CO2: 25 mmol/L (ref 22–32)
Calcium: 10.8 mg/dL — ABNORMAL HIGH (ref 8.9–10.3)
Chloride: 105 mmol/L (ref 98–111)
Creatinine, Ser: 0.72 mg/dL (ref 0.44–1.00)
GFR, Estimated: >60 mL/min (ref >60)
Glucose, Bld: 100 mg/dL — ABNORMAL HIGH (ref 70–99)
Potassium: 3.4 mmol/L — ABNORMAL LOW (ref 3.5–5.1)
Sodium: 138 mmol/L (ref 135–145)
Total Bilirubin: 0.1 mg/dL — ABNORMAL LOW (ref 0.3–1.2)
Total Protein: 7.4 g/dL (ref 6.5–8.1)

## 2021-11-01 LAB — FOLATE: Folate: 9 ng/mL (ref >5.9)

## 2021-11-01 LAB — FERRITIN: Ferritin: 10 ng/mL — ABNORMAL LOW (ref 11–307)

## 2021-11-01 LAB — VITAMIN B12: Vitamin B-12: 1714 pg/mL — ABNORMAL HIGH (ref 180–914)

## 2021-11-01 NOTE — Progress Notes (Signed)
Hematology/Oncology Consult note [phone] [phone]      Patient Care Team: [name], MD as PCP - General (Family Medicine) Marval Regal, NP as Nurse Practitioner (Gastroenterology) Earlie Server, MD as Consulting Physician (Hematology and Oncology)  REFERRING PROVIDER: Marval Regal, NP  CHIEF COMPLAINTS/REASON FOR VISIT:  Evaluation of anemia   HISTORY OF PRESENTING ILLNESS:   Linda Mcgee is a  66 y.o.  female with PMH listed below was seen in consultation at the request of  Marval Regal, NP  for evaluation of anemia 09/12/2021, patient had a CBC done.  Hemoglobin 8, MCV 94.5, white count 7.2, platelet count 569, 06/13/2021, iron panel showed TIBC 492, iron saturation 4, 08/24/2021, iron panel showed TIBC [birth year]1929, iron saturation 5.  Patient reports that she is chronic anemia.  She has been taking oral iron supplementation for years.  Previously, she takes on daily basis and then she takes once every 1 to 2 days.  After she was told that her iron levels were low in September/October 2022, she started back on taking iron supplementation daily.  + Fatigue, but she feels she is very functioning.  Denies shortness of breath.  Denies seeing any blood in the stool.  Denies family history of cancer.  Review of Systems  Constitutional:  Positive for fatigue. Negative for appetite change, chills and fever.  HENT:   Negative for hearing loss and voice change.   Eyes:  Negative for eye problems.  Respiratory:  Negative for chest tightness and cough.   Cardiovascular:  Negative for chest pain.  Gastrointestinal:  Negative for abdominal distention, abdominal pain and blood in stool.  Endocrine: Negative for hot flashes.  Genitourinary:  Negative for difficulty urinating and frequency.   Musculoskeletal:  Negative for arthralgias.  Skin:  Negative for itching and rash.  Neurological:  Negative for extremity weakness.  Hematological:   Negative for adenopathy.  Psychiatric/Behavioral:  Negative for confusion.    MEDICAL HISTORY:  Past Medical History:  Diagnosis Date   Abdominal pain, epigastric    Alcohol-induced chronic pancreatitis (HCC)    Anemia    Asthma    Hepatic steatosis    History of pancreatitis    Hypertension    Hypokalemia    Iron deficiency anemia     SURGICAL HISTORY: Past Surgical History:  Procedure Laterality Date   COLONOSCOPY  06/2011   LAPAROSCOPIC OOPHERECTOMY Left     SOCIAL HISTORY: Social History   Socioeconomic History   Marital status: Widowed    Spouse name: Not on file   Number of children: Not on file   Years of education: Not on file   Highest education level: Not on file  Occupational History   Not on file  Tobacco Use   Smoking status: Former   Smokeless tobacco: Never  Vaping Use   Vaping Use: Never used  Substance and Sexual Activity   Alcohol use: Yes    Comment: 4 bottles of wine weekly   Drug use: Never   Sexual activity: Not Currently  Other Topics Concern   Not on file  Social History Narrative   Not on file   Social Determinants of Health   Financial Resource Strain: Not on file  Food Insecurity: Not on file  Transportation Needs: Not on file  Physical Activity: Not on file  Stress: Not on file  Social Connections: Not on file  Intimate Partner Violence: Not on file    FAMILY HISTORY: Family History  Problem Relation  Age of Onset   Hypertension Mother     ALLERGIES:  is allergic to lisinopril-hydrochlorothiazide.  MEDICATIONS:  Current Outpatient Medications  Medication Sig Dispense Refill   ADVAIR DISKUS 250-50 MCG/DOSE AEPB Inhale 1 puff into the lungs every 12 (twelve) hours.     albuterol (VENTOLIN HFA) 108 (90 Base) MCG/ACT inhaler Inhale 2 puffs into the lungs every 4 (four) hours as needed for wheezing or shortness of breath.     ferrous sulfate 325 (65 FE) MG tablet Take 1 tablet (325 mg total) by mouth daily with breakfast.  30 tablet 0   losartan-hydrochlorothiazide (HYZAAR) 100-25 MG tablet Take 1 tablet by mouth daily.     triamcinolone (NASACORT) 55 MCG/ACT AERO nasal inhaler Place 2 sprays into the nose daily.     KLOR-CON M20 20 MEQ tablet Take 20 mEq by mouth 2 (two) times daily. (Patient not taking: Reported on 11/01/2021)     No current facility-administered medications for this visit.     PHYSICAL EXAMINATION: ECOG PERFORMANCE STATUS: 1 - Symptomatic but completely ambulatory Vitals:   11/01/21 1459  BP: 138/66  Pulse: 67  Resp: 20  Temp: 97.8 F (36.6 C)  SpO2: 100%   Filed Weights   11/01/21 1459  Weight: 133 lb 4.8 oz (60.5 kg)    Physical Exam Constitutional:      General: She is not in acute distress. HENT:     Head: Normocephalic and atraumatic.  Eyes:     General: No scleral icterus. Cardiovascular:     Rate and Rhythm: Normal rate and regular rhythm.     Heart sounds: Normal heart sounds.  Pulmonary:     Effort: Pulmonary effort is normal. No respiratory distress.     Breath sounds: No wheezing.  Abdominal:     General: Bowel sounds are normal. There is no distension.     Palpations: Abdomen is soft.  Musculoskeletal:        General: No deformity. Normal range of motion.     Cervical back: Normal range of motion and neck supple.  Skin:    General: Skin is warm and dry.     Findings: No erythema or rash.  Neurological:     Mental Status: She is alert and oriented to person, place, and time. Mental status is at baseline.     Cranial Nerves: No cranial nerve deficit.     Coordination: Coordination normal.  Psychiatric:        Mood and Affect: Mood normal.    LABORATORY DATA:  I have reviewed the data as listed Lab Results  Component Value Date   WBC 6.6 11/01/2021   HGB 6.2 (L) 11/01/2021   HCT 21.3 (L) 11/01/2021   MCV 86.2 11/01/2021   PLT 497 (H) 11/01/2021   Recent Labs    09/12/21 1026 11/01/21 1538  NA  --  138  K  --  3.4*  CL  --  105  CO2  --   25  GLUCOSE  --  100*  BUN  --  14  CREATININE 0.70 0.72  CALCIUM  --  10.8*  GFRNONAA  --  >60  PROT  --  7.4  ALBUMIN  --  4.2  AST  --  15  ALT  --  18  ALKPHOS  --  51  BILITOT  --  0.1*   Iron/TIBC/Ferritin/ %Sat    Component Value Date/Time   IRON 14 (L) 11/01/2021 1538   TIBC 465 (H) 11/01/2021 1538  FERRITIN 10 (L) 11/01/2021 1538   IRONPCTSAT 3 (L) 11/01/2021 1538      RADIOGRAPHIC STUDIES: I have personally reviewed the radiological images as listed and agreed with the findings in the report. No results found.    ASSESSMENT & PLAN:  1. Iron deficiency anemia due to chronic blood loss   2. Abnormal screening mammogram   3. Chronic alcohol use    #For anemia work-up, I recommend checking CBC, CMP, multiple myeloma panel, light chain ratio, iron TIBC ferritin, B12, folate, check smear. Discussed with patient about possibility of anemia secondary to iron deficiency.  Patient has been taking oral iron supplementation.  We discussed about the option of IV Venofer treatments.  Rationale and potential side effects were reviewed and discussed with patient.  She agrees with the plan if she needs IV Venofer treatments. - today's lab shows severe anemia with iron deficiency. Hb <7, she denies being very symptomatic.  Will arrange her to get IV venofer weekly x 4. Awaiting other labs.   Upon reviewing the chart, she seems to have an abnormal screening mammogram in October 2022.  Further evaluation was suggested for possible asymmetry in the right breast.  I recommend patient to get diagnostic mammogram.  We will further discussed with patient at next visit.  Alcohol use. Check B12, folate.  Recommend Alcohol cessation   Orders Placed This Encounter  Procedures   Ferritin    Standing Status:   Future    Number of Occurrences:   1    Standing Expiration Date:   05/01/2022   Folate    Standing Status:   Future    Number of Occurrences:   1    Standing Expiration Date:    11/01/2022   Vitamin B12    Standing Status:   Future    Number of Occurrences:   1    Standing Expiration Date:   11/01/2022   Technologist smear review    Standing Status:   Future    Number of Occurrences:   1    Standing Expiration Date:   11/01/2022   Comprehensive metabolic panel    Standing Status:   Future    Number of Occurrences:   1    Standing Expiration Date:   11/01/2022   CBC with Differential/Platelet    Standing Status:   Future    Number of Occurrences:   1    Standing Expiration Date:   11/01/2022   Retic Panel    Standing Status:   Future    Number of Occurrences:   1    Standing Expiration Date:   11/01/2022   Multiple Myeloma Panel (SPEP&IFE w/QIG)    Standing Status:   Future    Number of Occurrences:   1    Standing Expiration Date:   11/01/2022   Kappa/lambda light chains    Standing Status:   Future    Number of Occurrences:   1    Standing Expiration Date:   11/01/2022   Lactate dehydrogenase    Standing Status:   Future    Number of Occurrences:   1    Standing Expiration Date:   11/01/2022   Iron and TIBC([facility])    Standing Status:   Future    Number of Occurrences:   1    Standing Expiration Date:   11/01/2022    All questions were answered. The patient knows to call the clinic with any problems questions or concerns.  cc Denice Paradise  A, NP    Return of visit: 3 weeks to review results.  Thank you for this kind referral and the opportunity to participate in the care of this patient. A copy of today's note is routed to referring provider   Earlie Server, MD, PhD [facility] Hematology Oncology 11/01/2021

## 2021-11-01 NOTE — Addendum Note (Signed)
Addended by: Earlie Server on: 11/01/2021 09:27 PM   Modules accepted: Orders

## 2021-11-01 NOTE — Progress Notes (Signed)
Patient states no concerns at the moment. 

## 2021-11-02 ENCOUNTER — Telehealth: Payer: Self-pay

## 2021-11-02 LAB — KAPPA/LAMBDA LIGHT CHAINS
Kappa free light chain: 38.2 mg/L — ABNORMAL HIGH (ref 3.3–19.4)
Kappa, lambda light chain ratio: 1.63 (ref 0.26–1.65)
Lambda free light chains: 23.4 mg/L (ref 5.7–26.3)

## 2021-11-02 NOTE — Progress Notes (Signed)
Phone note created 

## 2021-11-02 NOTE — Telephone Encounter (Signed)
-----   Message from Earlie Server, MD sent at 11/01/2021  9:26 PM EST ----- Severe anemia with iron deficiency. Please arrange her to get either IV Venofer twice a week for 4 doses, first dose ASAP. Also ask patient to increase oral iron from daily to twice daily until she start IV venofer [ she may stop oral iron once iv iron starts].

## 2021-11-02 NOTE — Telephone Encounter (Signed)
Called to inform patient of lab results, no answer, LMTCB.

## 2021-11-05 LAB — MULTIPLE MYELOMA PANEL, SERUM
Albumin SerPl Elph-Mcnc: 3.6 g/dL (ref 2.9–4.4)
Albumin/Glob SerPl: 1.1 (ref 0.7–1.7)
Alpha 1: 0.3 g/dL (ref 0.0–0.4)
Alpha2 Glob SerPl Elph-Mcnc: 0.8 g/dL (ref 0.4–1.0)
B-Globulin SerPl Elph-Mcnc: 1.1 g/dL (ref 0.7–1.3)
Gamma Glob SerPl Elph-Mcnc: 1.4 g/dL (ref 0.4–1.8)
Globulin, Total: 3.6 g/dL (ref 2.2–3.9)
IgA: 221 mg/dL (ref 87–352)
IgG (Immunoglobin G), Serum: 1197 mg/dL (ref 586–1602)
IgM (Immunoglobulin M), Srm: 174 mg/dL (ref 26–217)
Total Protein ELP: 7.2 g/dL (ref 6.0–8.5)

## 2021-11-05 NOTE — Telephone Encounter (Signed)
Call pt to inform of appt, no answer.

## 2021-11-05 NOTE — Telephone Encounter (Signed)
Pt informed of plan. Please schedule and inform pt of appts.  IV venofer *new* twice a week (at least 48 hrs apart) for 2 weeks ( total of 4 doses)

## 2021-11-06 NOTE — Telephone Encounter (Signed)
Called pt to schedule appts, no answer. Left VM to return call.

## 2021-11-06 NOTE — Telephone Encounter (Signed)
Im not sure if pt works, but I called her ealry and she did not answer, I called her again in the afternoon and she was available then.

## 2021-11-07 NOTE — Telephone Encounter (Signed)
Pt did not answer. Will try again.

## 2021-11-13 ENCOUNTER — Encounter: Payer: Self-pay | Admitting: Oncology

## 2021-11-21 ENCOUNTER — Encounter: Payer: Self-pay | Admitting: Oncology

## 2021-11-21 ENCOUNTER — Other Ambulatory Visit: Payer: Self-pay

## 2021-11-21 ENCOUNTER — Inpatient Hospital Stay: Payer: Medicare Other | Attending: Oncology | Admitting: Oncology

## 2021-11-21 VITALS — BP 119/73 | HR 80 | Temp 97.9°F | Resp 18 | Wt 133.9 lb

## 2021-11-21 DIAGNOSIS — R5383 Other fatigue: Secondary | ICD-10-CM | POA: Insufficient documentation

## 2021-11-21 DIAGNOSIS — Z87891 Personal history of nicotine dependence: Secondary | ICD-10-CM | POA: Diagnosis not present

## 2021-11-21 DIAGNOSIS — R928 Other abnormal and inconclusive findings on diagnostic imaging of breast: Secondary | ICD-10-CM | POA: Diagnosis not present

## 2021-11-21 DIAGNOSIS — I10 Essential (primary) hypertension: Secondary | ICD-10-CM | POA: Insufficient documentation

## 2021-11-21 DIAGNOSIS — D500 Iron deficiency anemia secondary to blood loss (chronic): Secondary | ICD-10-CM | POA: Diagnosis not present

## 2021-11-21 NOTE — Progress Notes (Signed)
Patient here for follow up. Reports increased frequency in dizziness

## 2021-11-21 NOTE — Progress Notes (Signed)
Hematology/Oncology Progress note [phone] [phone]         Patient Care Team: [name], MD as PCP - General (Family Medicine) Marval Regal, NP as Nurse Practitioner (Gastroenterology) Earlie Server, MD as Consulting Physician (Hematology and Oncology)  REFERRING PROVIDER: [name], MD  CHIEF COMPLAINTS/REASON FOR VISIT:  Follow-up for anemia   HISTORY OF PRESENTING ILLNESS:   Linda Mcgee is a  66 y.o.  female with PMH listed below was seen in consultation at the request of  [name], MD  for evaluation of anemia 09/12/2021, patient had a CBC done.  Hemoglobin 8, MCV 94.5, white count 7.2, platelet count 569, 06/13/2021, iron panel showed TIBC 492, iron saturation 4, 08/24/2021, iron panel showed TIBC 01/13/1929, iron saturation 5.  Patient reports that she is chronic anemia.  She has been taking oral iron supplementation for years.  Previously, she takes on daily basis and then she takes once every 1 to 2 days.  After she was told that her iron levels were low in September/October 2022, she started back on taking iron supplementation daily.  + Fatigue, but she feels she is very functioning.  Denies shortness of breath.  Denies seeing any blood in the stool.  Denies family history of cancer.  INTERVAL HISTORY Linda Mcgee is a 66 y.o. female who has above history reviewed by me today presents for follow up  Patient had a blood work done after last visit.  She was found to have severe anemia with a hemoglobin of 6.2 and severe iron deficiency.  I recommend patient to start IV Venofer treatments ASAP.  Our team was not able to reach patient after multiple attempts.  Voice message was left.  Patient has not started treatments.  Today she denies dizziness or/lightheadedness, chest pain.  Review of Systems  Constitutional:  Positive for fatigue. Negative for appetite change, chills and fever.  HENT:   Negative for hearing loss and  voice change.   Eyes:  Negative for eye problems.  Respiratory:  Negative for chest tightness and cough.   Cardiovascular:  Negative for chest pain.  Gastrointestinal:  Negative for abdominal distention, abdominal pain and blood in stool.  Endocrine: Negative for hot flashes.  Genitourinary:  Negative for difficulty urinating and frequency.   Musculoskeletal:  Negative for arthralgias.  Skin:  Negative for itching and rash.  Neurological:  Negative for extremity weakness.  Hematological:  Negative for adenopathy.  Psychiatric/Behavioral:  Negative for confusion.    MEDICAL HISTORY:  Past Medical History:  Diagnosis Date   Abdominal pain, epigastric    Alcohol-induced chronic pancreatitis (HCC)    Anemia    Asthma    Hepatic steatosis    History of pancreatitis    Hypertension    Hypokalemia    IDA (iron deficiency anemia) 11/01/2021   Iron deficiency anemia     SURGICAL HISTORY: Past Surgical History:  Procedure Laterality Date   COLONOSCOPY  06/2011   LAPAROSCOPIC OOPHERECTOMY Left     SOCIAL HISTORY: Social History   Socioeconomic History   Marital status: Widowed    Spouse name: Not on file   Number of children: Not on file   Years of education: Not on file   Highest education level: Not on file  Occupational History   Not on file  Tobacco Use   Smoking status: Former   Smokeless tobacco: Never  Vaping Use   Vaping Use: Never used  Substance and Sexual Activity   Alcohol  use: Yes    Comment: 4 bottles of wine weekly   Drug use: Never   Sexual activity: Not Currently  Other Topics Concern   Not on file  Social History Narrative   Not on file   Social Determinants of Health   Financial Resource Strain: Not on file  Food Insecurity: Not on file  Transportation Needs: Not on file  Physical Activity: Not on file  Stress: Not on file  Social Connections: Not on file  Intimate Partner Violence: Not on file    FAMILY HISTORY: Family History  Problem  Relation Age of Onset   Hypertension Mother     ALLERGIES:  is allergic to lisinopril-hydrochlorothiazide.  MEDICATIONS:  Current Outpatient Medications  Medication Sig Dispense Refill   ADVAIR DISKUS 250-50 MCG/DOSE AEPB Inhale 1 puff into the lungs every 12 (twelve) hours.     albuterol (VENTOLIN HFA) 108 (90 Base) MCG/ACT inhaler Inhale 2 puffs into the lungs every 4 (four) hours as needed for wheezing or shortness of breath.     ferrous sulfate 325 (65 FE) MG tablet Take 1 tablet (325 mg total) by mouth daily with breakfast. 30 tablet 0   losartan-hydrochlorothiazide (HYZAAR) 100-25 MG tablet Take 1 tablet by mouth daily.     triamcinolone (NASACORT) 55 MCG/ACT AERO nasal inhaler Place 2 sprays into the nose daily.     KLOR-CON M20 20 MEQ tablet Take 20 mEq by mouth 2 (two) times daily. (Patient not taking: Reported on 11/01/2021)     No current facility-administered medications for this visit.     PHYSICAL EXAMINATION: ECOG PERFORMANCE STATUS: 1 - Symptomatic but completely ambulatory Vitals:   11/21/21 0950  BP: 119/73  Pulse: 80  Resp: 18  Temp: 97.9 F (36.6 C)   Filed Weights   11/21/21 0950  Weight: 133 lb 14.4 oz (60.7 kg)    Physical Exam Constitutional:      General: She is not in acute distress. HENT:     Head: Normocephalic and atraumatic.  Eyes:     General: No scleral icterus. Cardiovascular:     Rate and Rhythm: Normal rate and regular rhythm.     Heart sounds: Normal heart sounds.  Pulmonary:     Effort: Pulmonary effort is normal. No respiratory distress.     Breath sounds: No wheezing.  Abdominal:     General: Bowel sounds are normal. There is no distension.     Palpations: Abdomen is soft.  Musculoskeletal:        General: No deformity. Normal range of motion.     Cervical back: Normal range of motion and neck supple.  Skin:    General: Skin is warm and dry.     Coloration: Skin is pale.     Findings: No erythema or rash.  Neurological:      Mental Status: She is alert and oriented to person, place, and time. Mental status is at baseline.     Cranial Nerves: No cranial nerve deficit.     Coordination: Coordination normal.  Psychiatric:        Mood and Affect: Mood normal.    LABORATORY DATA:  I have reviewed the data as listed Lab Results  Component Value Date   WBC 6.6 11/01/2021   HGB 6.2 (L) 11/01/2021   HCT 21.3 (L) 11/01/2021   MCV 86.2 11/01/2021   PLT 497 (H) 11/01/2021   Recent Labs    09/12/21 1026 11/01/21 1538  NA  --  138  K  --  3.4*  CL  --  105  CO2  --  25  GLUCOSE  --  100*  BUN  --  14  CREATININE 0.70 0.72  CALCIUM  --  10.8*  GFRNONAA  --  >60  PROT  --  7.4  ALBUMIN  --  4.2  AST  --  15  ALT  --  18  ALKPHOS  --  51  BILITOT  --  0.1*    Iron/TIBC/Ferritin/ %Sat    Component Value Date/Time   IRON 14 (L) 11/01/2021 1538   TIBC 465 (H) 11/01/2021 1538   FERRITIN 10 (L) 11/01/2021 1538   IRONPCTSAT 3 (L) 11/01/2021 1538       RADIOGRAPHIC STUDIES: I have personally reviewed the radiological images as listed and agreed with the findings in the report. No results found.    ASSESSMENT & PLAN:  1. Iron deficiency anemia due to chronic blood loss   2. Abnormal screening mammogram    # severe iron deficiency anemia. Discussed about the rationale and potential side effects of IV Venofer treatments. She agrees.  We will schedule patient to get IV Venofer x 4 doses, [with at least 3 days interval]. Suspect GI blood loss.  Patient has establish care with GI for evaluation of endoscopy.   She also has abdominal screening mammogram.  Her primary care provider has ordered diagnostic mammogram and she has not had it done yet.  I discussed about importance of further evaluation with a diagnostic mammogram and the patient says she is going to call mammogram center.  Patient will follow-up in 6 weeks with NP for evaluation of need of additional IV iron treatments. Follow up in  [facility] June, lab MD +/- Venofer.   No orders of the defined types were placed in this encounter.   All questions were answered. The patient knows to call the clinic with any problems questions or concerns.  cc [name], MD   Earlie Server, MD, PhD [facility] Hematology Oncology 11/21/2021

## 2021-11-22 ENCOUNTER — Inpatient Hospital Stay: Payer: Medicare Other

## 2021-11-22 VITALS — BP 138/59 | HR 74 | Temp 97.6°F | Resp 18

## 2021-11-22 DIAGNOSIS — D500 Iron deficiency anemia secondary to blood loss (chronic): Secondary | ICD-10-CM | POA: Diagnosis not present

## 2021-11-22 MED ORDER — IRON SUCROSE 20 MG/ML IV SOLN
200.0000 mg | Freq: Once | INTRAVENOUS | Status: AC
  Administered 2021-11-22: 200 mg via INTRAVENOUS
  Filled 2021-11-22: qty 10

## 2021-11-22 MED ORDER — SODIUM CHLORIDE 0.9 % IV SOLN
Freq: Once | INTRAVENOUS | Status: AC
  Filled 2021-11-22: qty 250

## 2021-11-22 MED ORDER — SODIUM CHLORIDE 0.9 % IV SOLN: 200.0000 mg | Freq: Once | INTRAVENOUS | Status: DC

## 2021-11-22 NOTE — Patient Instructions (Signed)
[facility]   ?Discharge Instructions: ?Thank you for choosing [facility] to provide your oncology and hematology care.  ?If you have a lab appointment with the Cancer Center, please go directly to the Cancer Center and check in at the registration area. ?  ?We strive to give you quality time with your provider. You may need to reschedule your appointment if you arrive late (15 or more minutes).  Arriving late affects you and other patients whose appointments are after yours.  Also, if you miss three or more appointments without notifying the office, you may be dismissed from the clinic at the provider?s discretion.    ?  ?For prescription refill requests, have your pharmacy contact our office and allow 72 hours for refills to be completed.   ? ?Today you received the following: Venofer.    ?  ?BELOW ARE SYMPTOMS THAT SHOULD BE REPORTED IMMEDIATELY: ?*FEVER GREATER THAN 100.4 F (38 ?C) OR HIGHER ?*CHILLS OR SWEATING ?*NAUSEA AND VOMITING THAT IS NOT CONTROLLED WITH YOUR NAUSEA MEDICATION ?*UNUSUAL SHORTNESS OF BREATH ?*UNUSUAL BRUISING OR BLEEDING ?*URINARY PROBLEMS (pain or burning when urinating, or frequent urination) ?*BOWEL PROBLEMS (unusual diarrhea, constipation, pain near the anus) ?TENDERNESS IN MOUTH AND THROAT WITH OR WITHOUT PRESENCE OF ULCERS (sore throat, sores in mouth, or a toothache) ?UNUSUAL RASH, SWELLING OR PAIN  ?UNUSUAL VAGINAL DISCHARGE OR ITCHING  ? ?Items with * indicate a potential emergency and should be followed up as soon as possible or go to the Emergency Department if any problems should occur. ? ?Should you have questions after your visit or need to cancel or reschedule your appointment, please contact [facility]  Dept: [phone]  and follow the prompts.  Office hours are 8:00 a.m. to 4:30 p.m. Monday - Friday. Please note that voicemails left after 4:00 p.m. may not be returned until the following  business day.  We are closed weekends and major holidays. You have access to a nurse at all times for urgent questions. Please call the main number to the clinic Dept: [phone] and follow the prompts. ? ?For any non-urgent questions, you may also contact your provider using MyChart. We now offer e-Visits for anyone 18 and older to request care online for non-urgent symptoms. For details visit [email or web address]. ?  ?Also download the MyChart app! Go to the app store, search "MyChart", open the app, select [place], and log in with your MyChart username and password. ? ?Due to Covid, a mask is required upon entering the hospital/clinic. If you do not have a mask, one will be given to you upon arrival. For doctor visits, patients may have 1 support person aged 18 or older with them. For treatment visits, patients cannot have anyone with them due to current Covid guidelines and our immunocompromised population.  ?

## 2021-11-27 ENCOUNTER — Inpatient Hospital Stay: Payer: Medicare Other

## 2021-11-27 ENCOUNTER — Other Ambulatory Visit: Payer: Self-pay

## 2021-11-27 VITALS — BP 124/74 | HR 74 | Temp 98.0°F | Resp 18

## 2021-11-27 DIAGNOSIS — D500 Iron deficiency anemia secondary to blood loss (chronic): Secondary | ICD-10-CM

## 2021-11-27 MED ORDER — SODIUM CHLORIDE 0.9 % IV SOLN: 200.0000 mg | Freq: Once | INTRAVENOUS | Status: DC

## 2021-11-27 MED ORDER — IRON SUCROSE 20 MG/ML IV SOLN
200.0000 mg | Freq: Once | INTRAVENOUS | Status: AC
  Administered 2021-11-27: 200 mg via INTRAVENOUS

## 2021-11-27 MED ORDER — SODIUM CHLORIDE 0.9 % IV SOLN
Freq: Once | INTRAVENOUS | Status: AC
  Filled 2021-11-27: qty 250

## 2021-11-30 ENCOUNTER — Inpatient Hospital Stay: Payer: Medicare Other

## 2021-11-30 ENCOUNTER — Other Ambulatory Visit: Payer: Self-pay

## 2021-11-30 VITALS — BP 139/64 | HR 61 | Temp 96.0°F | Resp 19

## 2021-11-30 DIAGNOSIS — D500 Iron deficiency anemia secondary to blood loss (chronic): Secondary | ICD-10-CM

## 2021-11-30 MED ORDER — SODIUM CHLORIDE 0.9 % IV SOLN
Freq: Once | INTRAVENOUS | Status: AC
  Filled 2021-11-30: qty 250

## 2021-11-30 MED ORDER — SODIUM CHLORIDE 0.9 % IV SOLN: 200.0000 mg | Freq: Once | INTRAVENOUS | Status: DC

## 2021-11-30 MED ORDER — IRON SUCROSE 20 MG/ML IV SOLN
200.0000 mg | Freq: Once | INTRAVENOUS | Status: AC
  Administered 2021-11-30: 200 mg via INTRAVENOUS
  Filled 2021-11-30: qty 10

## 2021-11-30 NOTE — Patient Instructions (Signed)

## 2021-12-04 ENCOUNTER — Other Ambulatory Visit: Payer: Self-pay

## 2021-12-04 ENCOUNTER — Inpatient Hospital Stay: Payer: Medicare Other

## 2021-12-04 VITALS — BP 137/83 | HR 91 | Temp 97.8°F | Resp 18

## 2021-12-04 DIAGNOSIS — D500 Iron deficiency anemia secondary to blood loss (chronic): Secondary | ICD-10-CM | POA: Diagnosis not present

## 2021-12-04 MED ORDER — SODIUM CHLORIDE 0.9 % IV SOLN
Freq: Once | INTRAVENOUS | Status: AC
  Filled 2021-12-04: qty 250

## 2021-12-04 MED ORDER — IRON SUCROSE 20 MG/ML IV SOLN
200.0000 mg | Freq: Once | INTRAVENOUS | Status: AC
  Administered 2021-12-04: 200 mg via INTRAVENOUS
  Filled 2021-12-04: qty 10

## 2021-12-04 MED ORDER — SODIUM CHLORIDE 0.9 % IV SOLN: 200.0000 mg | Freq: Once | INTRAVENOUS | Status: DC

## 2021-12-06 ENCOUNTER — Encounter: Payer: Self-pay | Admitting: Oncology

## 2021-12-07 ENCOUNTER — Encounter: Payer: Self-pay | Admitting: Oncology

## 2021-12-10 ENCOUNTER — Encounter: Payer: Self-pay | Admitting: *Deleted

## 2021-12-10 ENCOUNTER — Encounter: Payer: Self-pay | Admitting: Oncology

## 2021-12-10 NOTE — Anesthesia Preprocedure Evaluation (Addendum)
Anesthesia Evaluation  Patient identified by MRN, date of birth, ID band Patient awake    Reviewed: Allergy & Precautions, NPO status , Patient's Chart, lab work & pertinent test results  History of Anesthesia Complications Negative for: history of anesthetic complications  Airway Mallampati: II  TM Distance: >3 FB Neck ROM: full    Dental  (+) Dental Advidsory Given, Upper Dentures, Lower Dentures, Edentulous Upper, Edentulous Lower   Pulmonary neg shortness of breath, asthma , neg COPD, neg recent URI, former smoker,    Pulmonary exam normal        Cardiovascular Exercise Tolerance: Good hypertension, Pt. on medications (-) angina(-) Past MI and (-) Cardiac Stents Normal cardiovascular exam(-) dysrhythmias (-) Valvular Problems/Murmurs     Neuro/Psych negative neurological ROS  negative psych ROS   GI/Hepatic Neg liver ROS, Nausea.Vomiting Abd pain  Alcohol-induced chronic pancreatitis    Endo/Other  negative endocrine ROS  Renal/GU negative Renal ROS  negative genitourinary   Musculoskeletal   Abdominal   Peds  Hematology  (+) Blood dyscrasia (IDA), anemia ,   Anesthesia Other Findings Pt had a recent hgb 6.2 on 11/01/21 with complaints of dizziness according to note review. She was suppose to start IV Iron therapy.    Past Medical History: No date: Abdominal pain, epigastric No date: Alcohol-induced chronic pancreatitis (HCC) No date: Anemia No date: Asthma No date: Hepatic steatosis No date: History of pancreatitis No date: Hypertension No date: Hypokalemia 11/01/2021: IDA (iron deficiency anemia) No date: Iron deficiency anemia  Past Surgical History: 06/2011: COLONOSCOPY No date: LAPAROSCOPIC OOPHERECTOMY; Left     Reproductive/Obstetrics negative OB ROS                           Anesthesia Physical Anesthesia Plan  ASA: 3  Anesthesia Plan: General   Post-op  Pain Management:    Induction: Intravenous  PONV Risk Score and Plan: 3 and Propofol infusion and TIVA  Airway Management Planned: Natural Airway and Nasal Cannula  Additional Equipment:   Intra-op Plan:   Post-operative Plan:   Informed Consent:   Plan Discussed with: Anesthesiologist, CRNA and Surgeon  Anesthesia Plan Comments:       Anesthesia Quick Evaluation

## 2021-12-11 ENCOUNTER — Encounter
Admission: RE | Disposition: A | Discharge status: Home / Self Care | Payer: Self-pay | Source: Home / Self Care | Attending: Gastroenterology

## 2021-12-11 ENCOUNTER — Ambulatory Visit: Payer: Medicare Other | Admitting: Anesthesiology

## 2021-12-11 ENCOUNTER — Encounter: Payer: Self-pay | Admitting: *Deleted

## 2021-12-11 ENCOUNTER — Ambulatory Visit
Admission: RE | Admit: 2021-12-11 | Discharge: 2021-12-11 | Disposition: A | Discharge status: Home / Self Care | Payer: Medicare Other | Attending: Gastroenterology | Admitting: Gastroenterology

## 2021-12-11 DIAGNOSIS — K5730 Diverticulosis of large intestine without perforation or abscess without bleeding: Secondary | ICD-10-CM | POA: Diagnosis not present

## 2021-12-11 DIAGNOSIS — I10 Essential (primary) hypertension: Secondary | ICD-10-CM | POA: Insufficient documentation

## 2021-12-11 DIAGNOSIS — K640 First degree hemorrhoids: Secondary | ICD-10-CM | POA: Insufficient documentation

## 2021-12-11 DIAGNOSIS — K2970 Gastritis, unspecified, without bleeding: Secondary | ICD-10-CM | POA: Insufficient documentation

## 2021-12-11 DIAGNOSIS — K319 Disease of stomach and duodenum, unspecified: Secondary | ICD-10-CM | POA: Insufficient documentation

## 2021-12-11 DIAGNOSIS — K860 Alcohol-induced chronic pancreatitis: Secondary | ICD-10-CM | POA: Insufficient documentation

## 2021-12-11 DIAGNOSIS — K449 Diaphragmatic hernia without obstruction or gangrene: Secondary | ICD-10-CM | POA: Diagnosis not present

## 2021-12-11 DIAGNOSIS — Z87891 Personal history of nicotine dependence: Secondary | ICD-10-CM | POA: Diagnosis not present

## 2021-12-11 DIAGNOSIS — D509 Iron deficiency anemia, unspecified: Secondary | ICD-10-CM | POA: Insufficient documentation

## 2021-12-11 DIAGNOSIS — Z7951 Long term (current) use of inhaled steroids: Secondary | ICD-10-CM | POA: Insufficient documentation

## 2021-12-11 DIAGNOSIS — J45909 Unspecified asthma, uncomplicated: Secondary | ICD-10-CM | POA: Diagnosis not present

## 2021-12-11 DIAGNOSIS — D124 Benign neoplasm of descending colon: Secondary | ICD-10-CM | POA: Insufficient documentation

## 2021-12-11 DIAGNOSIS — E876 Hypokalemia: Secondary | ICD-10-CM

## 2021-12-11 DIAGNOSIS — Z79899 Other long term (current) drug therapy: Secondary | ICD-10-CM | POA: Diagnosis not present

## 2021-12-11 DIAGNOSIS — K760 Fatty (change of) liver, not elsewhere classified: Secondary | ICD-10-CM | POA: Insufficient documentation

## 2021-12-11 HISTORY — PX: COLONOSCOPY WITH PROPOFOL: SHX5780

## 2021-12-11 HISTORY — PX: ESOPHAGOGASTRODUODENOSCOPY (EGD) WITH PROPOFOL: SHX5813

## 2021-12-11 LAB — CBC
HCT: 30 % — ABNORMAL LOW (ref 36.0–46.0)
Hemoglobin: 8.7 g/dL — ABNORMAL LOW (ref 12.0–15.0)
MCH: 26.9 pg (ref 26.0–34.0)
MCHC: 29 g/dL — ABNORMAL LOW (ref 30.0–36.0)
MCV: 92.9 fL (ref 80.0–100.0)
Platelets: 485 10*3/uL — ABNORMAL HIGH (ref 150–400)
RBC: 3.23 MIL/uL — ABNORMAL LOW (ref 3.87–5.11)
WBC: 4.9 10*3/uL (ref 4.0–10.5)
nRBC: 0 % (ref 0.0–0.2)

## 2021-12-11 LAB — POTASSIUM: Potassium: 3.6 mmol/L (ref 3.5–5.1)

## 2021-12-11 SURGERY — COLONOSCOPY WITH PROPOFOL
Anesthesia: General

## 2021-12-11 MED ORDER — PROPOFOL 10 MG/ML IV BOLUS
INTRAVENOUS | Status: AC
  Filled 2021-12-11: qty 20

## 2021-12-11 MED ORDER — PROPOFOL 500 MG/50ML IV EMUL
INTRAVENOUS | Status: DC | PRN
  Administered 2021-12-11: 175 ug/kg/min via INTRAVENOUS

## 2021-12-11 MED ORDER — PROPOFOL 10 MG/ML IV BOLUS
INTRAVENOUS | Status: DC | PRN
  Administered 2021-12-11 (×2): 50 mg via INTRAVENOUS

## 2021-12-11 MED ORDER — SODIUM CHLORIDE 0.9 % IV SOLN: INTRAVENOUS | Status: DC

## 2021-12-11 MED ORDER — PROPOFOL 500 MG/50ML IV EMUL
INTRAVENOUS | Status: AC
  Filled 2021-12-11: qty 50

## 2021-12-11 NOTE — Anesthesia Postprocedure Evaluation (Signed)
Anesthesia Post Note  Patient: Linda Mcgee  Procedure(s) Performed: COLONOSCOPY WITH PROPOFOL ESOPHAGOGASTRODUODENOSCOPY (EGD) WITH PROPOFOL  Patient location during evaluation: Endoscopy Anesthesia Type: General Level of consciousness: awake and alert Pain management: pain level controlled Vital Signs Assessment: post-procedure vital signs reviewed and stable Respiratory status: spontaneous breathing, nonlabored ventilation, respiratory function stable and patient connected to nasal cannula oxygen Cardiovascular status: blood pressure returned to baseline and stable Postop Assessment: no apparent nausea or vomiting Anesthetic complications: no   No notable events documented.   Last Vitals:  Vitals:   12/11/21 1256 12/11/21 1306  BP: 129/71 (!) 146/77  Pulse: 62 (!) 59  Resp: (!) 22 (!) 27  Temp:    SpO2: 100% 100%    Last Pain:  Vitals:   12/11/21 1306  TempSrc:   PainSc: 0-No pain                 Martha Clan

## 2021-12-11 NOTE — Op Note (Signed)
[facility] Patient Name: Linda Mcgee Procedure Date: 12/11/2021 11:28 AM MRN: [id number] Account #: [id number] Date of Birth: Feb 27, 1956 Admit Type: Outpatient Age: 66 Room: [facility] ENDO ROOM 1 Gender: Female Note Status: Finalized Instrument Name: Jasper Riling [id number] Procedure:             Colonoscopy Indications:           Iron deficiency anemia Providers:             Andrey Farmer MD, MD Medicines:             Monitored Anesthesia Care Complications:         No immediate complications. Estimated blood loss:                         Minimal. Procedure:             Pre-Anesthesia Assessment:                        - Prior to the procedure, a History and Physical was                         performed, and patient medications and allergies were                         reviewed. The patient is competent. The risks and                         benefits of the procedure and the sedation options and                         risks were discussed with the patient. All questions                         were answered and informed consent was obtained.                         Patient identification and proposed procedure were                         verified by the physician, the nurse, the                         anesthesiologist, the anesthetist and the technician                         in the endoscopy suite. Mental Status Examination:                         alert and oriented. Airway Examination: normal                         oropharyngeal airway and neck mobility. Respiratory                         Examination: clear to auscultation. CV Examination:                         normal. Prophylactic Antibiotics: The patient does not  require prophylactic antibiotics. Prior                         Anticoagulants: The patient has taken no previous                         anticoagulant or antiplatelet agents. ASA Grade                          Assessment: II - A patient with mild systemic disease.                         After reviewing the risks and benefits, the patient                         was deemed in satisfactory condition to undergo the                         procedure. The anesthesia plan was to use monitored                         anesthesia care (MAC). Immediately prior to                         administration of medications, the patient was                         re-assessed for adequacy to receive sedatives. The                         heart rate, respiratory rate, oxygen saturations,                         blood pressure, adequacy of pulmonary ventilation, and                         response to care were monitored throughout the                         procedure. The physical status of the patient was                         re-assessed after the procedure.                        After obtaining informed consent, the colonoscope was                         passed under direct vision. Throughout the procedure,                         the patient's blood pressure, pulse, and oxygen                         saturations were monitored continuously. The                         Colonoscope was introduced through the anus and  advanced to the the cecum, identified by appendiceal                         orifice and ileocecal valve. The colonoscopy was                         performed without difficulty. The patient tolerated                         the procedure well. The quality of the bowel                         preparation was good. Findings:      The perianal and digital rectal examinations were normal.      A 2 mm polyp was found in the descending colon. The polyp was sessile.       The polyp was removed with a cold snare. Resection and retrieval were       complete. Estimated blood loss was minimal.      A few small-mouthed diverticula were found in the descending colon.       Internal hemorrhoids were found during retroflexion. The hemorrhoids       were Grade I (internal hemorrhoids that do not prolapse).      The exam was otherwise without abnormality on direct and retroflexion       views. Attempted TI intubation but was not successful. If clinically       indicated can check fecal calprotectin +- CTE. Impression:            - One 2 mm polyp in the descending colon, removed with                         a cold snare. Resected and retrieved.                        - Diverticulosis in the descending colon.                        - Internal hemorrhoids.                        - The examination was otherwise normal on direct and                         retroflexion views. Recommendation:        - Discharge patient to home.                        - Resume previous diet.                        - Continue present medications.                        - Await pathology results.                        - Repeat colonoscopy for surveillance based on                         pathology results.                        -  Return to referring physician as previously                         scheduled. Procedure Code(s):     --- Professional ---                        [id number], Colonoscopy, flexible; with removal of                         tumor(s), polyp(s), or other lesion(s) by snare                         technique Diagnosis Code(s):     --- Professional ---                        K63.5, Polyp of colon                        K64.0, First degree hemorrhoids                        D50.9, Iron deficiency anemia, unspecified                        K57.30, Diverticulosis of large intestine without                         perforation or abscess without bleeding CPT copyright 2019 American Medical Association. All rights reserved. The codes documented in this report are preliminary and upon coder review may  be revised to meet current compliance requirements. Andrey Farmer  MD, MD 12/11/2021 12:49:34 PM Number of Addenda: 0 Note Initiated On: 12/11/2021 11:28 AM Scope Withdrawal Time: 0 hours 10 minutes 16 seconds  Total Procedure Duration: 0 hours 14 minutes 43 seconds  Estimated Blood Loss:  Estimated blood loss was minimal.      [facility]

## 2021-12-11 NOTE — Transfer of Care (Signed)
Immediate Anesthesia Transfer of Care Note  Patient: Linda Mcgee  Procedure(s) Performed: COLONOSCOPY WITH PROPOFOL ESOPHAGOGASTRODUODENOSCOPY (EGD) WITH PROPOFOL  Patient Location: PACU  Anesthesia Type:General  Level of Consciousness: awake and alert   Airway & Oxygen Therapy: Patient Spontanous Breathing  Post-op Assessment: Report given to RN and Post -op Vital signs reviewed and stable  Post vital signs: Reviewed and stable  Last Vitals:  Vitals Value Taken Time  BP 103/65 12/11/21 1249  Temp 35.8 C 12/11/21 1246  Pulse 74 12/11/21 1250  Resp 32 12/11/21 1250  SpO2 100 % 12/11/21 1250  Vitals shown include unvalidated device data.  Last Pain:  Vitals:   12/11/21 1246  TempSrc: Temporal  PainSc: 0-No pain         Complications: No notable events documented.

## 2021-12-11 NOTE — Interval H&P Note (Signed)
History and Physical Interval Note:  12/11/2021 12:13 PM  Linda Mcgee  has presented today for surgery, with the diagnosis of IDA Abd Pain Nausea.Vomiting.  The various methods of treatment have been discussed with the patient and family. After consideration of risks, benefits and other options for treatment, the patient has consented to  Procedure(s): COLONOSCOPY WITH PROPOFOL (N/A) ESOPHAGOGASTRODUODENOSCOPY (EGD) WITH PROPOFOL (N/A) as a surgical intervention.  The patient's history has been reviewed, patient examined, no change in status, stable for surgery.  I have reviewed the patient's chart and labs.  Questions were answered to the patient's satisfaction.     Lesly Rubenstein  Ok to proceed with EGD/Colonoscopy

## 2021-12-11 NOTE — Op Note (Signed)
[facility] Patient Name: Linda Mcgee Procedure Date: 12/11/2021 11:29 AM MRN: [id number] Account #: [id number] Date of Birth: 18-Jan-1956 Admit Type: Outpatient Age: 66 Room: [facility] ENDO ROOM 1 Gender: Female Note Status: Finalized Instrument Name: Altamese Cabal Endoscope [id number] Procedure:             Upper GI endoscopy Indications:           Iron deficiency anemia Providers:             Andrey Farmer MD, MD Medicines:             Monitored Anesthesia Care Complications:         No immediate complications. Estimated blood loss:                         Minimal. Procedure:             Pre-Anesthesia Assessment:                        - Prior to the procedure, a History and Physical was                         performed, and patient medications and allergies were                         reviewed. The patient is competent. The risks and                         benefits of the procedure and the sedation options and                         risks were discussed with the patient. All questions                         were answered and informed consent was obtained.                         Patient identification and proposed procedure were                         verified by the physician, the nurse, the                         anesthesiologist, the anesthetist and the technician                         in the endoscopy suite. Mental Status Examination:                         alert and oriented. Airway Examination: normal                         oropharyngeal airway and neck mobility. Respiratory                         Examination: clear to auscultation. CV Examination:                         normal. Prophylactic Antibiotics: The patient does not  require prophylactic antibiotics. Prior                         Anticoagulants: The patient has taken no previous                         anticoagulant or antiplatelet agents. ASA Grade                          Assessment: II - A patient with mild systemic disease.                         After reviewing the risks and benefits, the patient                         was deemed in satisfactory condition to undergo the                         procedure. The anesthesia plan was to use monitored                         anesthesia care (MAC). Immediately prior to                         administration of medications, the patient was                         re-assessed for adequacy to receive sedatives. The                         heart rate, respiratory rate, oxygen saturations,                         blood pressure, adequacy of pulmonary ventilation, and                         response to care were monitored throughout the                         procedure. The physical status of the patient was                         re-assessed after the procedure.                        After obtaining informed consent, the endoscope was                         passed under direct vision. Throughout the procedure,                         the patient's blood pressure, pulse, and oxygen                         saturations were monitored continuously. The Endoscope                         was introduced through the mouth, and advanced to the  second part of duodenum. The upper GI endoscopy was                         accomplished without difficulty. The patient tolerated                         the procedure well. Findings:      A small hiatal hernia was present.      The exam of the esophagus was otherwise normal.      Patchy mild inflammation characterized by adherent blood was found in       the gastric antrum. Biopsies were taken with a cold forceps for       Helicobacter pylori testing. Estimated blood loss was minimal.      The exam of the stomach was otherwise normal.      The examined duodenum was normal. Impression:            - Small hiatal hernia.                         - Gastritis. Biopsied.                        - Normal examined duodenum. Recommendation:        - Await pathology results.                        - Perform a colonoscopy today. Procedure Code(s):     --- Professional ---                        [phone], Esophagogastroduodenoscopy, flexible,                         transoral; with biopsy, single or multiple Diagnosis Code(s):     --- Professional ---                        K44.9, Diaphragmatic hernia without obstruction or                         gangrene                        K29.70, Gastritis, unspecified, without bleeding                        D50.9, Iron deficiency anemia, unspecified CPT copyright 2019 American Medical Association. All rights reserved. The codes documented in this report are preliminary and upon coder review may  be revised to meet current compliance requirements. Andrey Farmer MD, MD 12/11/2021 12:46:22 PM Number of Addenda: 0 Note Initiated On: 12/11/2021 11:29 AM Estimated Blood Loss:  Estimated blood loss was minimal.      [facility]

## 2021-12-11 NOTE — H&P (Signed)
Outpatient short stay form Pre-procedure 12/11/2021  Linda Rubenstein, MD  Primary Physician: [name], MD  Reason for visit:  IDA  History of present illness:    66 y/o lady with history of hypertension and IDA here for EGD/Colonoscopy. No blood thinners. No family history of colon cancer. No significant abdominal surgeries. Last colonoscopy was over 10 years ago.    Current Facility-Administered Medications:    0.9 %  sodium chloride infusion, , Intravenous, Continuous, Demario Faniel, Hilton Cork, MD, Last Rate: 20 mL/hr at 12/11/21 1150, New Bag at 12/11/21 1150  Medications Prior to Admission  Medication Sig Dispense Refill Last Dose   losartan-hydrochlorothiazide (HYZAAR) 100-25 MG tablet Take 1 tablet by mouth daily.   12/11/2021   ADVAIR DISKUS 250-50 MCG/DOSE AEPB Inhale 1 puff into the lungs every 12 (twelve) hours.      albuterol (VENTOLIN HFA) 108 (90 Base) MCG/ACT inhaler Inhale 2 puffs into the lungs every 4 (four) hours as needed for wheezing or shortness of breath.      ferrous sulfate 325 (65 FE) MG tablet Take 1 tablet (325 mg total) by mouth daily with breakfast. 30 tablet 0    KLOR-CON M20 20 MEQ tablet Take 20 mEq by mouth 2 (two) times daily. (Patient not taking: Reported on 11/01/2021)      triamcinolone (NASACORT) 55 MCG/ACT AERO nasal inhaler Place 2 sprays into the nose daily.        Allergies  Allergen Reactions   Lisinopril-Hydrochlorothiazide Cough    Patient states affected by Lisinopril not HCTZ     Past Medical History:  Diagnosis Date   Abdominal pain, epigastric    Alcohol-induced chronic pancreatitis (HCC)    Anemia    Asthma    Hepatic steatosis    History of pancreatitis    Hypertension    Hypokalemia    IDA (iron deficiency anemia) 11/01/2021   Iron deficiency anemia     Review of systems:  Otherwise negative.    Physical Exam  Gen: Alert, oriented. Appears stated age.  HEENT: PERRLA. Lungs: No respiratory distress CV:  RRR Abd: soft, benign, no masses Ext: No edema    Planned procedures: Proceed with EGD/colonoscopy. The patient understands the nature of the planned procedure, indications, risks, alternatives and potential complications including but not limited to bleeding, infection, perforation, damage to internal organs and possible oversedation/side effects from anesthesia. The patient agrees and gives consent to proceed.  Please refer to procedure notes for findings, recommendations and patient disposition/instructions.     Linda Rubenstein, MD [facility]

## 2021-12-12 ENCOUNTER — Encounter: Payer: Self-pay | Admitting: Gastroenterology

## 2021-12-12 LAB — SURGICAL PATHOLOGY

## 2021-12-25 ENCOUNTER — Encounter: Payer: Self-pay | Admitting: Oncology

## 2021-12-25 ENCOUNTER — Other Ambulatory Visit: Payer: Self-pay | Admitting: *Deleted

## 2021-12-25 DIAGNOSIS — D500 Iron deficiency anemia secondary to blood loss (chronic): Secondary | ICD-10-CM

## 2022-01-01 ENCOUNTER — Other Ambulatory Visit: Payer: Self-pay

## 2022-01-01 ENCOUNTER — Inpatient Hospital Stay: Payer: Medicare Other | Attending: Oncology

## 2022-01-01 DIAGNOSIS — D500 Iron deficiency anemia secondary to blood loss (chronic): Secondary | ICD-10-CM | POA: Insufficient documentation

## 2022-01-01 DIAGNOSIS — Z87891 Personal history of nicotine dependence: Secondary | ICD-10-CM | POA: Diagnosis not present

## 2022-01-01 DIAGNOSIS — I10 Essential (primary) hypertension: Secondary | ICD-10-CM | POA: Insufficient documentation

## 2022-01-01 LAB — CBC WITH DIFFERENTIAL/PLATELET
Abs Immature Granulocytes: 0.02 10*3/uL (ref 0.00–0.07)
Basophils Absolute: 0 10*3/uL (ref 0.0–0.1)
Basophils Relative: 1 %
Eosinophils Absolute: 0.2 10*3/uL (ref 0.0–0.5)
Eosinophils Relative: 4 %
HCT: 36.8 % (ref 36.0–46.0)
Hemoglobin: 11.3 g/dL — ABNORMAL LOW (ref 12.0–15.0)
Immature Granulocytes: 0 %
Lymphocytes Relative: 26 %
Lymphs Abs: 1.4 10*3/uL (ref 0.7–4.0)
MCH: 28.5 pg (ref 26.0–34.0)
MCHC: 30.7 g/dL (ref 30.0–36.0)
MCV: 92.9 fL (ref 80.0–100.0)
Monocytes Absolute: 0.7 10*3/uL (ref 0.1–1.0)
Monocytes Relative: 13 %
Neutro Abs: 3.1 10*3/uL (ref 1.7–7.7)
Neutrophils Relative %: 56 %
Platelets: 387 10*3/uL (ref 150–400)
RBC: 3.96 MIL/uL (ref 3.87–5.11)
RDW: 24 % — ABNORMAL HIGH (ref 11.5–15.5)
WBC: 5.5 10*3/uL (ref 4.0–10.5)
nRBC: 0 % (ref 0.0–0.2)

## 2022-01-01 LAB — FERRITIN: Ferritin: 25 ng/mL (ref 11–307)

## 2022-01-01 LAB — IRON AND TIBC
Iron: 189 ug/dL — ABNORMAL HIGH (ref 28–170)
Saturation Ratios: 41 % — ABNORMAL HIGH (ref 10.4–31.8)
TIBC: 458 ug/dL — ABNORMAL HIGH (ref 250–450)
UIBC: 269 ug/dL

## 2022-01-03 ENCOUNTER — Inpatient Hospital Stay: Payer: Medicare Other

## 2022-01-03 ENCOUNTER — Inpatient Hospital Stay: Payer: Medicare Other | Admitting: Medical Oncology

## 2022-01-03 ENCOUNTER — Other Ambulatory Visit: Payer: Self-pay

## 2022-01-03 ENCOUNTER — Encounter: Payer: Self-pay | Admitting: Medical Oncology

## 2022-01-03 VITALS — BP 132/86 | HR 76 | Temp 97.6°F | Resp 16 | Wt 135.0 lb

## 2022-01-03 DIAGNOSIS — D500 Iron deficiency anemia secondary to blood loss (chronic): Secondary | ICD-10-CM

## 2022-01-03 NOTE — Progress Notes (Signed)
Pt denies any concerns, states she can tell a difference since starting infusions.  Energy levels are improving.   ?

## 2022-01-03 NOTE — Progress Notes (Signed)
?Hematology/Oncology Progress note ?[phone] [phone] ?  ? ?   ? ? ?Patient Care Team: ?[name], MD as PCP - General (Family Medicine) ?Marval Regal, NP as Nurse Practitioner (Gastroenterology) ?Earlie Server, MD as Consulting Physician (Hematology and Oncology) ? ?REFERRING PROVIDER: ?[name], MD  ?CHIEF COMPLAINTS/REASON FOR VISIT:  ?Follow-up for anemia ? ? ?HISTORY OF PRESENTING ILLNESS:  ? ?Linda Mcgee is a  66 y.o.  female with PMH listed below was seen in consultation at the request of  [name], MD  for evaluation of anemia ?09/12/2021, patient had a CBC done.  Hemoglobin 8, MCV 94.5, white count 7.2, platelet count 569, ?06/13/2021, iron panel showed TIBC 492, iron saturation 4, ?08/24/2021, iron panel showed TIBC 01/13/1929, iron saturation 5. ? ?Patient reports that she is chronic anemia.  She has been taking oral iron supplementation for years.  Previously, she takes on daily basis and then she takes once every 1 to 2 days.  After she was told that her iron levels were low in September/October 2022, she started back on taking iron supplementation daily. At her last office visit she was shown to be severely anemic and was started on Venofer for 4 doses. She reports that she is now taking an iron supplement every night.  ? ?+ Fatigue, but she feels she is very functioning.  Denies shortness of breath.  Denies seeing any blood in the stool.  Denies family history of cancer. ? ?INTERVAL HISTORY ?Linda Mcgee is a 66 y.o. female who has above history reviewed by me today presents for follow up  ?Today she states that she is feeling very well. Much improved from when she first came to our office and was anemic. She suspects that her counts are better today. No bleeding episodes, hemoptysis, bloody stools. Since her last visit she had an endoscopy and colonoscopy. Endoscopy showed mild gastritis and colonoscopy showed one small TA. She return in 7 years for  repeat. She is now taking an iron supplement once nightly. Today she denies SOB, dizziness or/lightheadedness, chest pain. ? ?Review of Systems  ?Constitutional:  Negative for appetite change, chills, fatigue and fever.  ?HENT:   Negative for hearing loss and voice change.   ?Eyes:  Negative for eye problems.  ?Respiratory:  Negative for chest tightness and cough.   ?Cardiovascular:  Negative for chest pain.  ?Gastrointestinal:  Negative for abdominal distention, abdominal pain and blood in stool.  ?Endocrine: Negative for hot flashes.  ?Genitourinary:  Negative for difficulty urinating and frequency.   ?Musculoskeletal:  Negative for arthralgias.  ?Skin:  Negative for itching and rash.  ?Neurological:  Negative for extremity weakness.  ?Hematological:  Negative for adenopathy.  ?Psychiatric/Behavioral:  Negative for confusion.   ? ?MEDICAL HISTORY:  ?Past Medical History:  ?Diagnosis Date  ? Abdominal pain, epigastric   ? Alcohol-induced chronic pancreatitis ([place])   ? Anemia   ? Asthma   ? Hepatic steatosis   ? History of pancreatitis   ? Hypertension   ? Hypokalemia   ? IDA (iron deficiency anemia) 11/01/2021  ? Iron deficiency anemia   ? ? ?SURGICAL HISTORY: ?Past Surgical History:  ?Procedure Laterality Date  ? COLONOSCOPY  06/2011  ? COLONOSCOPY WITH PROPOFOL N/A 12/11/2021  ? Procedure: COLONOSCOPY WITH PROPOFOL;  Surgeon: Lesly Rubenstein, MD;  Location: [facility];  Service: Endoscopy;  Laterality: N/A;  ? ESOPHAGOGASTRODUODENOSCOPY (EGD) WITH PROPOFOL N/A 12/11/2021  ? Procedure: ESOPHAGOGASTRODUODENOSCOPY (EGD) WITH PROPOFOL;  Surgeon: Andrey Farmer  T, MD;  Location: [facility] ENDOSCOPY;  Service: Endoscopy;  Laterality: N/A;  ? LAPAROSCOPIC OOPHERECTOMY Left   ? ? ?SOCIAL HISTORY: ?Social History  ? ?Socioeconomic History  ? Marital status: Widowed  ?  Spouse name: Not on file  ? Number of children: Not on file  ? Years of education: Not on file  ? Highest education level: Not on file  ?Occupational  History  ? Not on file  ?Tobacco Use  ? Smoking status: Former  ? Smokeless tobacco: Never  ?Vaping Use  ? Vaping Use: Never used  ?Substance and Sexual Activity  ? Alcohol use: Yes  ?  Comment: 4 bottles of wine weekly  ? Drug use: Never  ? Sexual activity: Not Currently  ?Other Topics Concern  ? Not on file  ?Social History Narrative  ? Not on file  ? ?Social Determinants of Health  ? ?Financial Resource Strain: Not on file  ?Food Insecurity: Not on file  ?Transportation Needs: Not on file  ?Physical Activity: Not on file  ?Stress: Not on file  ?Social Connections: Not on file  ?Intimate Partner Violence: Not on file  ? ? ?FAMILY HISTORY: ?Family History  ?Problem Relation Age of Onset  ? Hypertension Mother   ? ? ?ALLERGIES:  is allergic to lisinopril-hydrochlorothiazide. ? ?MEDICATIONS:  ?Current Outpatient Medications  ?Medication Sig Dispense Refill  ? ADVAIR DISKUS 250-50 MCG/DOSE AEPB Inhale 1 puff into the lungs every 12 (twelve) hours.    ? albuterol (VENTOLIN HFA) 108 (90 Base) MCG/ACT inhaler Inhale 2 puffs into the lungs every 4 (four) hours as needed for wheezing or shortness of breath.    ? losartan-hydrochlorothiazide (HYZAAR) 100-25 MG tablet Take 1 tablet by mouth daily.    ? triamcinolone (NASACORT) 55 MCG/ACT AERO nasal inhaler Place 2 sprays into the nose daily.    ? ?No current facility-administered medications for this visit.  ? ? ? ?PHYSICAL EXAMINATION: ?ECOG PERFORMANCE STATUS: 1 - Symptomatic but completely ambulatory ?Vitals:  ? 01/03/22 1435  ?BP: 132/86  ?Pulse: 76  ?Resp: 16  ?Temp: 97.6 ?F (36.4 ?C)  ?SpO2: 100%  ? ?Filed Weights  ? 01/03/22 1435  ?Weight: 135 lb (61.2 kg)  ? ? ?Physical Exam ?Constitutional:   ?   General: She is not in acute distress. ?HENT:  ?   Head: Normocephalic and atraumatic.  ?Eyes:  ?   General: No scleral icterus. ?Cardiovascular:  ?   Rate and Rhythm: Normal rate and regular rhythm.  ?   Heart sounds: Normal heart sounds.  ?Pulmonary:  ?   Effort:  Pulmonary effort is normal. No respiratory distress.  ?   Breath sounds: No wheezing.  ?Abdominal:  ?   General: Bowel sounds are normal. There is no distension.  ?   Palpations: Abdomen is soft.  ?Musculoskeletal:     ?   General: No deformity. Normal range of motion.  ?   Cervical back: Normal range of motion and neck supple.  ?Skin: ?   General: Skin is warm and dry.  ?   Coloration: Skin is not pale.  ?   Findings: No erythema or rash.  ?Neurological:  ?   Mental Status: She is alert and oriented to person, place, and time. Mental status is at baseline.  ?   Cranial Nerves: No cranial nerve deficit.  ?   Coordination: Coordination normal.  ?Psychiatric:     ?   Mood and Affect: Mood normal.  ? ? ?LABORATORY DATA:  ?I  have reviewed the data as listed ?Lab Results  ?Component Value Date  ? WBC 5.5 01/01/2022  ? HGB 11.3 (L) 01/01/2022  ? HCT 36.8 01/01/2022  ? MCV 92.9 01/01/2022  ? PLT 387 01/01/2022  ? ?Recent Labs  ?  09/12/21 ?1026 11/01/21 ?1538 12/11/21 ?1105  ?NA  --  138  --   ?K  --  3.4* 3.6  ?CL  --  105  --   ?CO2  --  25  --   ?GLUCOSE  --  100*  --   ?BUN  --  14  --   ?CREATININE 0.70 0.72  --   ?CALCIUM  --  10.8*  --   ?GFRNONAA  --  >60  --   ?PROT  --  7.4  --   ?ALBUMIN  --  4.2  --   ?AST  --  15  --   ?ALT  --  18  --   ?ALKPHOS  --  51  --   ?BILITOT  --  0.1*  --   ? ?Iron/TIBC/Ferritin/ %Sat ?   ?Component Value Date/Time  ? IRON 189 (H) 01/01/2022 1132  ? TIBC 458 (H) 01/01/2022 1132  ? FERRITIN 25 01/01/2022 1132  ? IRONPCTSAT 41 (H) 01/01/2022 1132  ? ?  ? ? ?RADIOGRAPHIC STUDIES: ?I have personally reviewed the radiological images as listed and agreed with the findings in the report. ?No results found. ? ? ? ?ASSESSMENT & PLAN:  ?1. Iron deficiency anemia due to chronic blood loss   ? ?Iron deficiency anemia. ?Greatly improved lab values and she is now asymptomatic.  ?She will continue her iron supplementation daily but will take it in the morning instead to maximize absorption  (avoiding orange juice given gastritis) ? ? ?She is overdue for her mammogram which has been ordered. Patient to call and schedule.  ? ?Patient will follow-up in 2 months with Dr. Tasia Catchings for evaluation of need of additiona

## 2022-01-16 IMAGING — CT CT ABD-PELV W/ CM
2 of 5 series · 16 of 46 positions shown, 18 images · IV contrast (APPLIED)
Comparison: 05/17/2020

CLINICAL DATA: Abnormal lab results

Uncomfortable abdominal feeling for 1 week
Emesis
Sweats
EXAM:
CT ABDOMEN AND PELVIS WITH CONTRAST
TECHNIQUE: Multidetector CT imaging of the abdomen and pelvis was performed
using the standard protocol following bolus administration of
intravenous contrast.
CONTRAST:  100mL OMNIPAQUE IOHEXOL 300 MG/ML  SOLN

[Series 2: routine abd/pel with · axial · 0.70mm/px · z∈[-872,-502]mm · 13 of 84 slices shown, 15 images]
[im 5/84  soft-tissue]
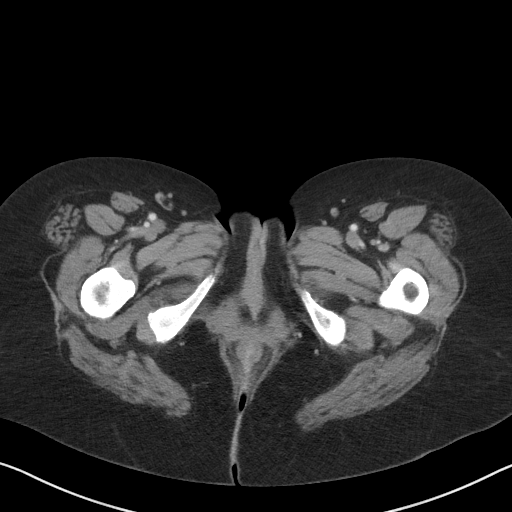
[im 5/84  bone]
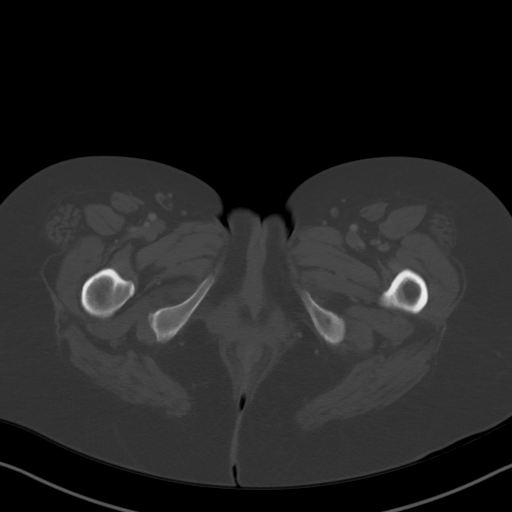
[im 10/84  soft-tissue]
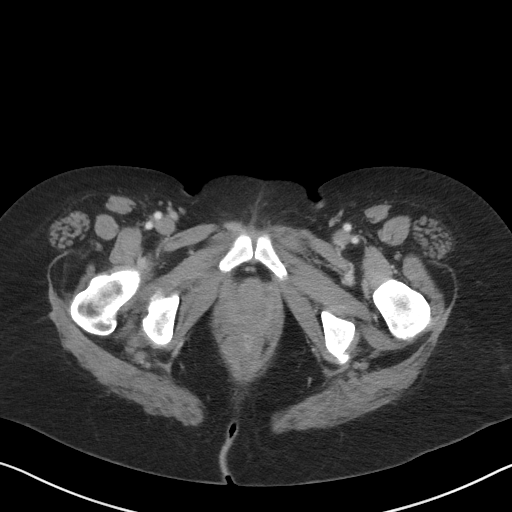
[im 19/84  soft-tissue]
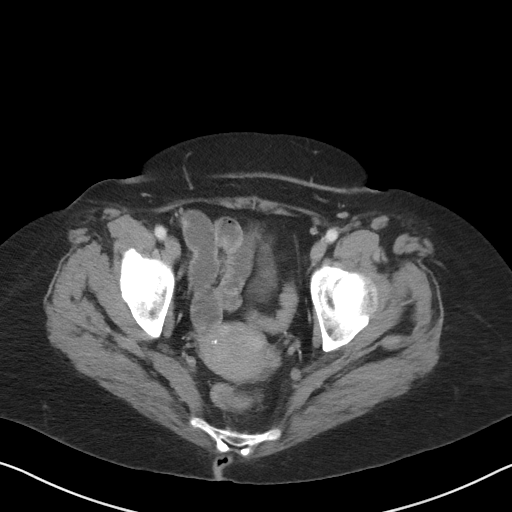
[im 24/84  soft-tissue]
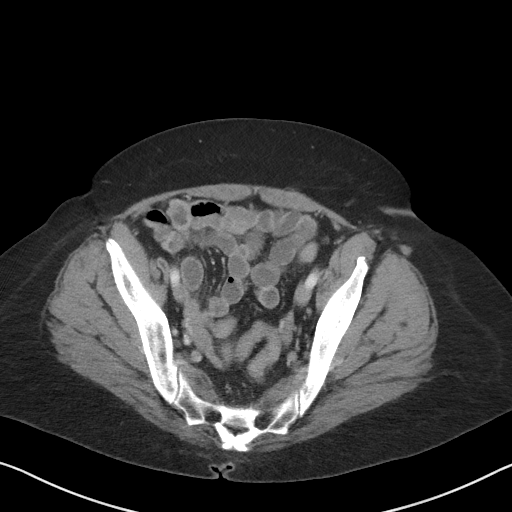
[im 28/84  soft-tissue]
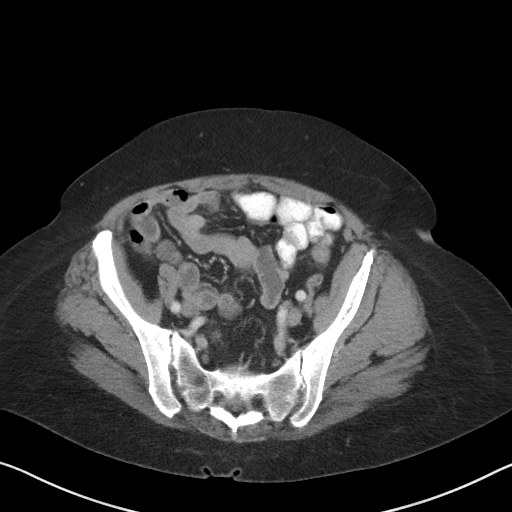
[im 37/84  soft-tissue]
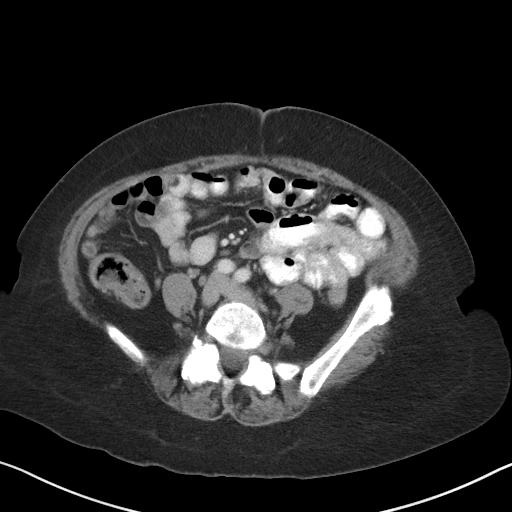
[im 42/84  soft-tissue]
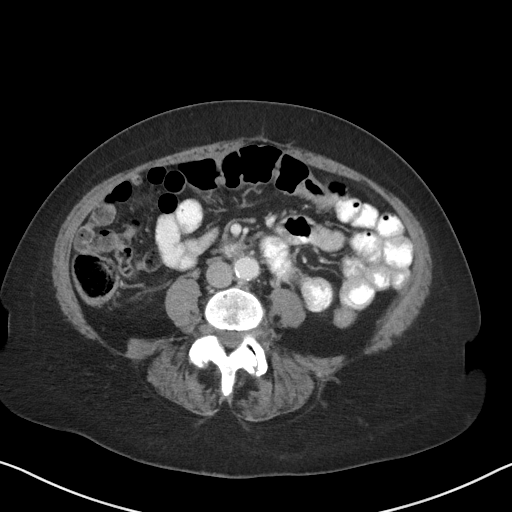
[im 47/84  soft-tissue]
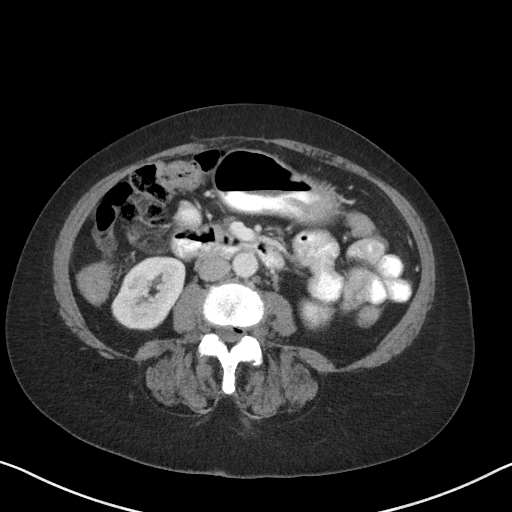
[im 56/84  soft-tissue]
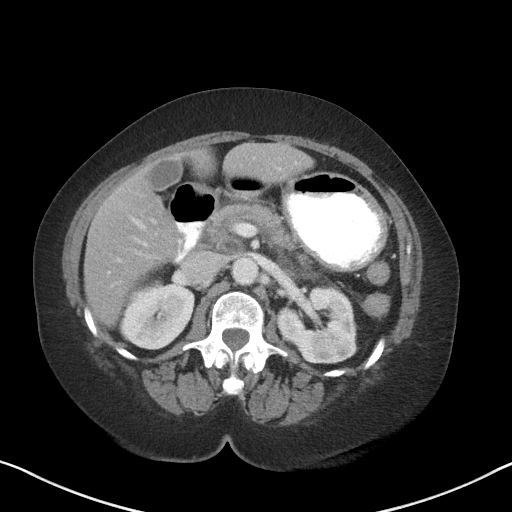
[im 56/84  bone]
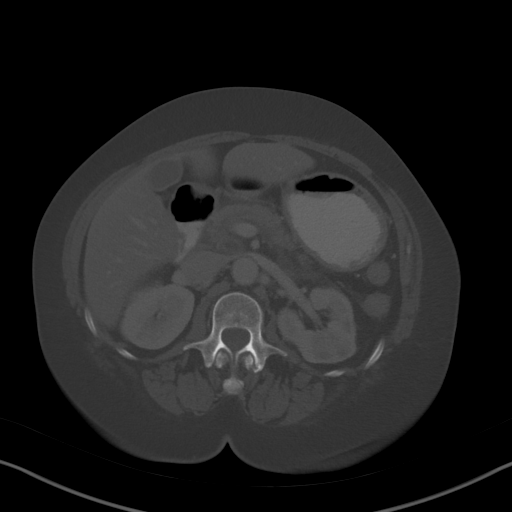
[im 60/84  soft-tissue]
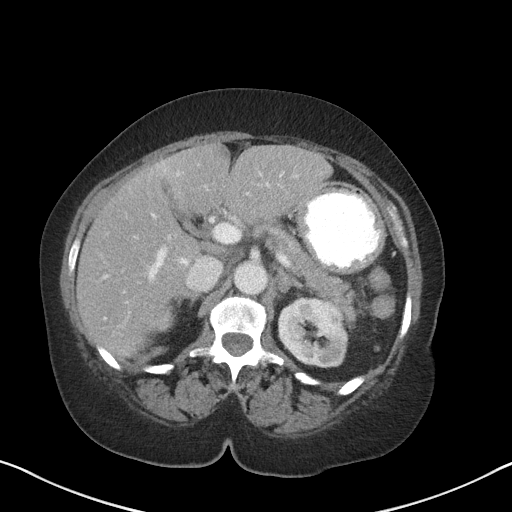
[im 65/84  soft-tissue]
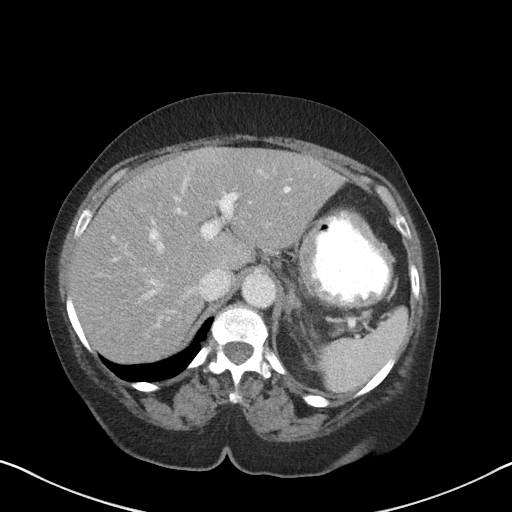
[im 74/84  soft-tissue]
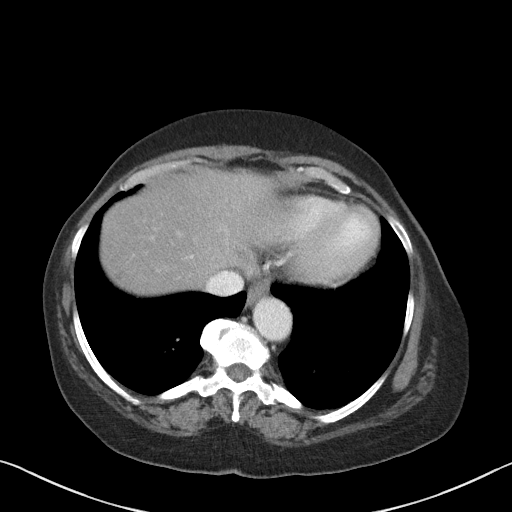
[im 79/84  soft-tissue]
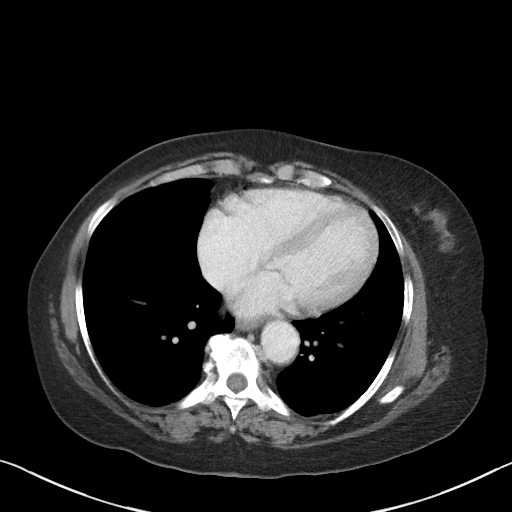

[Series 5: coronal st · coronal · 0.64mm/px · 3 of 92 slices shown]
[im 31/92  soft-tissue]
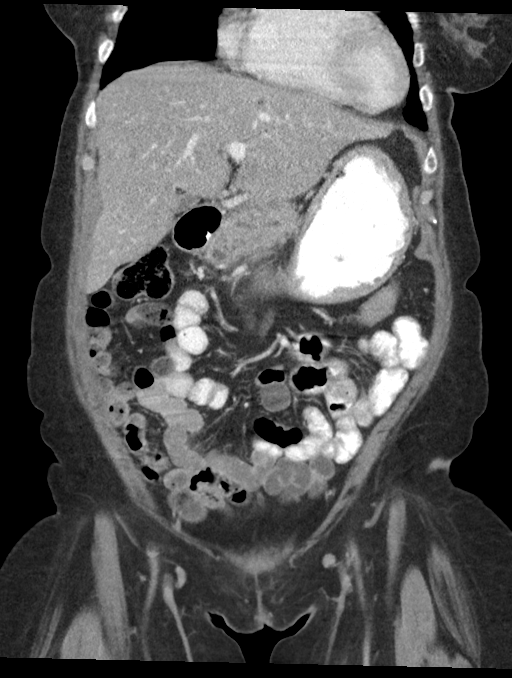
[im 41/92  soft-tissue]
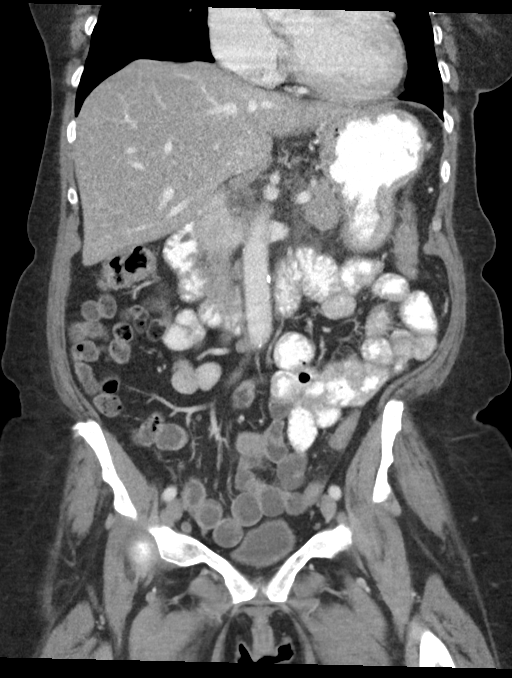
[im 51/92  soft-tissue]
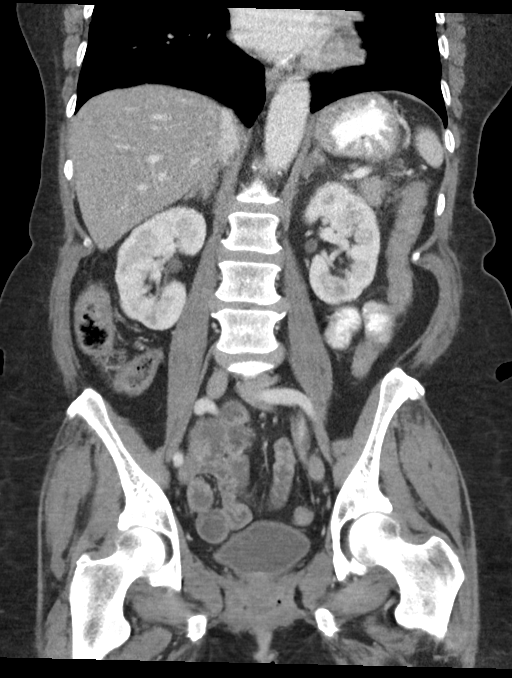

[16 of 46 positions shown; findings below may reference images not displayed]

FINDINGS: Lower chest: No acute abnormality.

Hepatobiliary: No significant abnormality of the liver or
gallbladder. There is thickening of the wall of the common bile duct
similar to prior examination.

Pancreas: Peripancreatic fat stranding is suspicious for acute
pancreatitis. No focal fluid collection to indicate pseudocyst
formation. No pancreatic duct dilatation.

Spleen: Normal in size without focal abnormality.

Adrenals/Urinary Tract: Adrenal glands are unremarkable. Kidneys are
normal, without renal calculi, focal lesion, or hydronephrosis.
Bladder is unremarkable.

Stomach/Bowel: Stomach is within normal limits. Appendix appears
normal. No evidence of bowel wall thickening, distention, or
inflammatory changes.

Vascular/Lymphatic: No significant vascular findings are present. No
enlarged abdominal or pelvic lymph nodes.

Reproductive: Lobulated appearance of the uterus is suspicious for
underlying fibroids. No adnexal abnormality identified.

Other: No abdominal wall hernia or abnormality. No abdominopelvic
ascites.

Musculoskeletal: Degenerative changes seen throughout the lumbar
spine. No acute osseous abnormality.
IMPRESSION: Peripancreatic fat stranding suspicious for acute pancreatitis.

## 2022-03-26 ENCOUNTER — Inpatient Hospital Stay: Payer: Medicare Other | Attending: Oncology

## 2022-03-28 ENCOUNTER — Inpatient Hospital Stay: Payer: Medicare Other

## 2022-03-28 ENCOUNTER — Encounter: Payer: Self-pay | Admitting: Oncology

## 2022-03-28 ENCOUNTER — Inpatient Hospital Stay: Payer: Medicare Other | Admitting: Oncology

## 2022-04-19 ENCOUNTER — Telehealth: Payer: Self-pay | Admitting: Oncology

## 2022-04-19 ENCOUNTER — Encounter: Payer: Self-pay | Admitting: Oncology

## 2022-04-19 NOTE — Telephone Encounter (Signed)
called pt to r/s appt from 6/15, Phone rang out and hung up .Marland KitchenKJ

## 2023-01-07 ENCOUNTER — Other Ambulatory Visit: Payer: Self-pay | Admitting: Family Medicine

## 2023-01-07 DIAGNOSIS — R109 Unspecified abdominal pain: Secondary | ICD-10-CM

## 2023-01-09 ENCOUNTER — Ambulatory Visit
Admission: RE | Admit: 2023-01-09 | Discharge: 2023-01-09 | Disposition: A | Discharge status: Home / Self Care | Payer: Medicare Other | Source: Ambulatory Visit | Attending: Family Medicine | Admitting: Family Medicine

## 2023-01-09 DIAGNOSIS — R109 Unspecified abdominal pain: Secondary | ICD-10-CM

## 2023-03-24 ENCOUNTER — Other Ambulatory Visit: Payer: Self-pay

## 2023-03-24 ENCOUNTER — Emergency Department
Admission: EM | Admit: 2023-03-24 | Discharge: 2023-03-24 | Disposition: A | Discharge status: Home / Self Care | Payer: Medicare Other | Attending: Emergency Medicine | Admitting: Emergency Medicine

## 2023-03-24 ENCOUNTER — Emergency Department: Payer: Medicare Other

## 2023-03-24 DIAGNOSIS — I10 Essential (primary) hypertension: Secondary | ICD-10-CM | POA: Diagnosis not present

## 2023-03-24 DIAGNOSIS — K852 Alcohol induced acute pancreatitis without necrosis or infection: Secondary | ICD-10-CM | POA: Diagnosis not present

## 2023-03-24 DIAGNOSIS — R1010 Upper abdominal pain, unspecified: Secondary | ICD-10-CM | POA: Diagnosis present

## 2023-03-24 DIAGNOSIS — J45909 Unspecified asthma, uncomplicated: Secondary | ICD-10-CM | POA: Diagnosis not present

## 2023-03-24 LAB — CBC
HCT: 25 % — ABNORMAL LOW (ref 36.0–46.0)
Hemoglobin: 7.5 g/dL — ABNORMAL LOW (ref 12.0–15.0)
MCH: 25.9 pg — ABNORMAL LOW (ref 26.0–34.0)
MCHC: 30 g/dL (ref 30.0–36.0)
MCV: 86.2 fL (ref 80.0–100.0)
Platelets: 517 10*3/uL — ABNORMAL HIGH (ref 150–400)
RBC: 2.9 MIL/uL — ABNORMAL LOW (ref 3.87–5.11)
RDW: 16.1 % — ABNORMAL HIGH (ref 11.5–15.5)
WBC: 5.8 10*3/uL (ref 4.0–10.5)
nRBC: 0 % (ref 0.0–0.2)

## 2023-03-24 LAB — COMPREHENSIVE METABOLIC PANEL
ALT: 10 U/L (ref 0–44)
AST: 17 U/L (ref 15–41)
Albumin: 4 g/dL (ref 3.5–5.0)
Alkaline Phosphatase: 57 U/L (ref 38–126)
Anion gap: 12 (ref 5–15)
BUN: 12 mg/dL (ref 8–23)
CO2: 23 mmol/L (ref 22–32)
Calcium: 10.3 mg/dL (ref 8.9–10.3)
Chloride: 106 mmol/L (ref 98–111)
Creatinine, Ser: 0.92 mg/dL (ref 0.44–1.00)
GFR, Estimated: >60 mL/min (ref >60)
Glucose, Bld: 118 mg/dL — ABNORMAL HIGH (ref 70–99)
Potassium: 3.2 mmol/L — ABNORMAL LOW (ref 3.5–5.1)
Sodium: 141 mmol/L (ref 135–145)
Total Bilirubin: 0.4 mg/dL (ref 0.3–1.2)
Total Protein: 7.1 g/dL (ref 6.5–8.1)

## 2023-03-24 LAB — LIPASE, BLOOD: Lipase: 255 U/L — ABNORMAL HIGH (ref 11–51)

## 2023-03-24 MED ORDER — ONDANSETRON 4 MG PO TBDP: 4.0000 mg | ORAL_TABLET | Freq: Three times a day (TID) | ORAL | 0 refills | Status: DC | PRN

## 2023-03-24 MED ORDER — POTASSIUM CHLORIDE CRYS ER 20 MEQ PO TBCR
40.0000 meq | EXTENDED_RELEASE_TABLET | Freq: Once | ORAL | Status: AC
  Administered 2023-03-24: 40 meq via ORAL
  Filled 2023-03-24: qty 2

## 2023-03-24 MED ORDER — ONDANSETRON HCL 4 MG/2ML IJ SOLN
4.0000 mg | Freq: Once | INTRAMUSCULAR | Status: AC
  Administered 2023-03-24: 4 mg via INTRAVENOUS
  Filled 2023-03-24: qty 2

## 2023-03-24 MED ORDER — OXYCODONE-ACETAMINOPHEN 5-325 MG PO TABS: 1.0000 | ORAL_TABLET | ORAL | 0 refills | Status: AC | PRN

## 2023-03-24 MED ORDER — MORPHINE SULFATE (PF) 4 MG/ML IV SOLN
4.0000 mg | Freq: Once | INTRAVENOUS | Status: AC
  Administered 2023-03-24: 4 mg via INTRAVENOUS
  Filled 2023-03-24: qty 1

## 2023-03-24 NOTE — ED Provider Triage Note (Signed)
Emergency Medicine Provider Triage Evaluation Note  Linda Mcgee , a 67 y.o. female  was evaluated in triage.  Pt complains of left side abdominal pain radiating into back x 2 hours with 2 episodes of vomiting. Similar symptoms with previous pancreatitis.   Physical Exam  There were no vitals taken for this visit. Gen:   Awake, no distress   Resp:  Normal effort  MSK:   Moves extremities without difficulty  Other:    Medical Decision Making  Medically screening exam initiated at 3:46 PM.  Appropriate orders placed.  Linda Mcgee was informed that the remainder of the evaluation will be completed by another provider, this initial triage assessment does not replace that evaluation, and the importance of remaining in the ED until their evaluation is complete.    Linda Pester, FNP 03/24/23 2047

## 2023-03-24 NOTE — ED Notes (Signed)
See triage note  Presents with upper abd pain  States pain started 2-3 days PTA   States pain is mid abd pain and radiating around to back

## 2023-03-24 NOTE — ED Triage Notes (Signed)
Arrives from home via [facility].   C/O Right and left upper abdominal pain.  Denies N/V/D.  States pain wraps around back.  Onset of pain this morning.   VS nwl.

## 2023-03-24 NOTE — ED Provider Notes (Signed)
[facility] Provider Note    Event Date/Time   First MD Initiated Contact with Patient 03/24/23 1716     (approximate)   History   Chief Complaint Abdominal Pain (X2 hours)   HPI  Linda Mcgee is a 67 y.o. female with past medical history of hypertension, asthma, pancreatitis, and iron deficiency anemia who presents to the ED complaining of abdominal pain.  Patient reports that she had acute onset of severe pain in her upper abdomen about 2 hours prior to arrival.  She states that it is most severe in the right upper quadrant and radiates around towards her back.  Pain has been present constant since onset and associated with some nausea and vomiting.  She denies any fevers, dysuria, or flank pain.  She describes symptoms as similar to when she dealt with pancreatitis in the past.  She does admit to drinking wine over the weekend, denies drug use.     Physical Exam   Triage Vital Signs: ED Triage Vitals  Enc Vitals Group     BP 03/24/23 1548 120/89     Pulse Rate 03/24/23 1546 62     Resp 03/24/23 1546 20     Temp 03/24/23 1546 97.7 F (36.5 C)     Temp Source 03/24/23 1546 Oral     SpO2 03/24/23 1546 100 %     Weight 03/24/23 1547 120 lb (54.4 kg)     Height 03/24/23 1547 5' (1.524 m)     Head Circumference --      Peak Flow --      Pain Score 03/24/23 1546 10     Pain Loc --      Pain Edu? --      Excl. in GC? --     Most recent vital signs: Vitals:   03/24/23 1548 03/24/23 2000  BP: 120/89   Pulse:    Resp:    Temp:  97.9 F (36.6 C)  SpO2:      Constitutional: Alert and oriented. Eyes: Conjunctivae are normal. Head: Atraumatic. Nose: No congestion/rhinnorhea. Mouth/Throat: Mucous membranes are moist.  Cardiovascular: Normal rate, regular rhythm. Grossly normal heart sounds.  2+ radial pulses bilaterally. Respiratory: Normal respiratory effort.  No retractions. Lungs CTAB. Gastrointestinal: Soft and tender to palpation in  the epigastrium and right upper quadrant with voluntary guarding.  No CVA tenderness bilaterally. No distention. Musculoskeletal: No lower extremity tenderness nor edema.  Neurologic:  Normal speech and language. No gross focal neurologic deficits are appreciated.    ED Results / Procedures / Treatments   Labs (all labs ordered are listed, but only abnormal results are displayed) Labs Reviewed  LIPASE, BLOOD - Abnormal; Notable for the following components:      Result Value   Lipase 255 (*)    All other components within normal limits  COMPREHENSIVE METABOLIC PANEL - Abnormal; Notable for the following components:   Potassium 3.2 (*)    Glucose, Bld 118 (*)    All other components within normal limits  CBC - Abnormal; Notable for the following components:   RBC 2.90 (*)    Hemoglobin 7.5 (*)    HCT 25.0 (*)    MCH 25.9 (*)    RDW 16.1 (*)    Platelets 517 (*)    All other components within normal limits  URINALYSIS, ROUTINE W REFLEX MICROSCOPIC   RADIOLOGY Right upper quadrant ultrasound reviewed and interpreted by me with no gallstones, wall thickening, or pericholecystic fluid.  PROCEDURES:  Critical Care performed: No  Procedures   MEDICATIONS ORDERED IN ED: Medications  ondansetron (ZOFRAN) injection 4 mg (4 mg Intravenous Given 03/24/23 1754)  morphine (PF) 4 MG/ML injection 4 mg (4 mg Intravenous Given 03/24/23 1754)  potassium chloride SA (KLOR-CON M) CR tablet 40 mEq (40 mEq Oral Given 03/24/23 2005)     IMPRESSION / MDM / ASSESSMENT AND PLAN / ED COURSE  I reviewed the triage vital signs and the nursing notes.                              67 y.o. female with past medical history of hypertension, asthma, anemia, and pancreatitis who presents to the ED complaining of acute severe pain in her epigastrium and right upper quadrant for about the past 2 hours.  Patient's presentation is most consistent with acute presentation with potential threat to life or  bodily function.  Differential diagnosis includes, but is not limited to, pancreatitis, hepatitis, biliary colic, cholecystitis, choledocholithiasis, gastritis, electrolyte abnormality, AKI.  Patient uncomfortable appearing, rolling around on stretcher and complaining of severe pain.  Vital signs are reassuring, abdomen is soft but she does have significant tenderness in the epigastrium and right upper quadrant.  We will further assess with right upper quadrant ultrasound, lipase elevated consistent with pancreatitis.  Labs significant for anemia, she has had low hemoglobin in the past but most recent blood work had been improved.  She denies any recent bleeding, will perform rectal exam when she is more comfortable.  No significant leukocytosis, lecture abnormality, or AKI noted.  LFTs are unremarkable.  We will treat symptomatically with IV morphine and Zofran, reassess following imaging.  Right upper quadrant ultrasound is unremarkable, patient reports symptoms improved following pain and nausea medication.  She is tolerating oral intake, was offered admission to the hospital, but declines.  She is appropriate for outpatient PCP follow-up, was counseled to avoid alcohol and to return to the ED for new or worsening symptoms.  Patient agrees with plan.      FINAL CLINICAL IMPRESSION(S) / ED DIAGNOSES   Final diagnoses:  Alcohol-induced acute pancreatitis, unspecified complication status     Rx / DC Orders   ED Discharge Orders          Ordered    oxyCODONE-acetaminophen (PERCOCET) 5-325 MG tablet  Every 4 hours PRN        03/24/23 2020    ondansetron (ZOFRAN-ODT) 4 MG disintegrating tablet  Every 8 hours PRN        03/24/23 2020             Note:  This document was prepared using Dragon voice recognition software and may include unintentional dictation errors.   Chesley Noon, MD 03/24/23 2021

## 2023-03-24 NOTE — ED Triage Notes (Signed)
Pt to ed from home via [facility] for abdominal pain x 2 hours. Pt is caox4, appears to be uncomfortable in triage. Pt has some nausea but no vomiting.

## 2023-05-29 ENCOUNTER — Emergency Department
Admission: EM | Admit: 2023-05-29 | Discharge: 2023-05-29 | Discharge status: Against Medical Advice | Payer: Medicare Other | Attending: Emergency Medicine | Admitting: Emergency Medicine

## 2023-05-29 ENCOUNTER — Other Ambulatory Visit: Payer: Self-pay

## 2023-05-29 DIAGNOSIS — Z5321 Procedure and treatment not carried out due to patient leaving prior to being seen by health care provider: Secondary | ICD-10-CM | POA: Diagnosis not present

## 2023-05-29 DIAGNOSIS — R109 Unspecified abdominal pain: Secondary | ICD-10-CM | POA: Diagnosis present

## 2023-05-29 DIAGNOSIS — R110 Nausea: Secondary | ICD-10-CM | POA: Insufficient documentation

## 2023-05-29 LAB — COMPREHENSIVE METABOLIC PANEL
ALT: 9 U/L (ref 0–44)
AST: 13 U/L — ABNORMAL LOW (ref 15–41)
Albumin: 3.8 g/dL (ref 3.5–5.0)
Alkaline Phosphatase: 55 U/L (ref 38–126)
Anion gap: 7 (ref 5–15)
BUN: 13 mg/dL (ref 8–23)
CO2: 24 mmol/L (ref 22–32)
Calcium: 10.5 mg/dL — ABNORMAL HIGH (ref 8.9–10.3)
Chloride: 108 mmol/L (ref 98–111)
Creatinine, Ser: 0.94 mg/dL (ref 0.44–1.00)
GFR, Estimated: >60 mL/min (ref >60)
Glucose, Bld: 112 mg/dL — ABNORMAL HIGH (ref 70–99)
Potassium: 3.6 mmol/L (ref 3.5–5.1)
Sodium: 139 mmol/L (ref 135–145)
Total Bilirubin: 0.6 mg/dL (ref 0.3–1.2)
Total Protein: 7.2 g/dL (ref 6.5–8.1)

## 2023-05-29 LAB — URINALYSIS, ROUTINE W REFLEX MICROSCOPIC
Bacteria, UA: NONE SEEN
Bilirubin Urine: NEGATIVE
Glucose, UA: NEGATIVE mg/dL
Hgb urine dipstick: NEGATIVE
Ketones, ur: 20 mg/dL — AB
Leukocytes,Ua: NEGATIVE
Nitrite: NEGATIVE
Protein, ur: 100 mg/dL — AB
Specific Gravity, Urine: 1.024 (ref 1.005–1.030)
pH: 7 (ref 5.0–8.0)

## 2023-05-29 LAB — CBC
HCT: 22.5 % — ABNORMAL LOW (ref 36.0–46.0)
Hemoglobin: 6.5 g/dL — ABNORMAL LOW (ref 12.0–15.0)
MCH: 22.3 pg — ABNORMAL LOW (ref 26.0–34.0)
MCHC: 28.9 g/dL — ABNORMAL LOW (ref 30.0–36.0)
MCV: 77.1 fL — ABNORMAL LOW (ref 80.0–100.0)
Platelets: 418 10*3/uL — ABNORMAL HIGH (ref 150–400)
RBC: 2.92 MIL/uL — ABNORMAL LOW (ref 3.87–5.11)
RDW: 19.1 % — ABNORMAL HIGH (ref 11.5–15.5)
WBC: 6.9 10*3/uL (ref 4.0–10.5)
nRBC: 0 % (ref 0.0–0.2)

## 2023-05-29 LAB — LIPASE, BLOOD: Lipase: 39 U/L (ref 11–51)

## 2023-05-29 NOTE — ED Triage Notes (Signed)
Pt to ED via POV from home. Pt reports abdominal cramps and nausea for the last few weeks. Pt denies V/D.

## 2023-05-29 NOTE — ED Notes (Signed)
Reviewed pt's labs; acuity level changed & will take pt next avail exam room

## 2023-05-29 NOTE — ED Notes (Signed)
Pt called and st she already left; instr to f/u with PCP regarding CC and results; pt voices understanding; instr to return for any new or worsening symptoms

## 2023-06-13 ENCOUNTER — Other Ambulatory Visit: Payer: Self-pay

## 2023-06-13 ENCOUNTER — Telehealth: Payer: Self-pay

## 2023-06-13 ENCOUNTER — Other Ambulatory Visit: Payer: Self-pay | Admitting: Nurse Practitioner

## 2023-06-13 DIAGNOSIS — R112 Nausea with vomiting, unspecified: Secondary | ICD-10-CM

## 2023-06-13 DIAGNOSIS — Z8719 Personal history of other diseases of the digestive system: Secondary | ICD-10-CM

## 2023-06-13 DIAGNOSIS — K760 Fatty (change of) liver, not elsewhere classified: Secondary | ICD-10-CM

## 2023-06-13 DIAGNOSIS — R110 Nausea: Secondary | ICD-10-CM

## 2023-06-13 DIAGNOSIS — R634 Abnormal weight loss: Secondary | ICD-10-CM

## 2023-06-13 DIAGNOSIS — D649 Anemia, unspecified: Secondary | ICD-10-CM

## 2023-06-13 DIAGNOSIS — D500 Iron deficiency anemia secondary to blood loss (chronic): Secondary | ICD-10-CM

## 2023-06-13 NOTE — Telephone Encounter (Signed)
-----   Message from Rickard Patience sent at 06/13/2023  1:41 PM EDT ----- She lost follow up. GI sent message, hb is low again.  Please arrange her to see me next week lab prior to MD +/- IV Venfoer +/- blood transfusion.  Check cbc iron tibc ferritin, retic panel, LFT

## 2023-06-17 ENCOUNTER — Telehealth: Payer: Self-pay

## 2023-06-17 ENCOUNTER — Inpatient Hospital Stay: Payer: Medicare Other | Attending: Oncology

## 2023-06-17 ENCOUNTER — Other Ambulatory Visit: Payer: Self-pay

## 2023-06-17 ENCOUNTER — Other Ambulatory Visit: Payer: Self-pay | Admitting: Oncology

## 2023-06-17 ENCOUNTER — Inpatient Hospital Stay: Payer: Medicare Other

## 2023-06-17 ENCOUNTER — Encounter: Payer: Self-pay | Admitting: Oncology

## 2023-06-17 DIAGNOSIS — Z23 Encounter for immunization: Secondary | ICD-10-CM | POA: Insufficient documentation

## 2023-06-17 DIAGNOSIS — R634 Abnormal weight loss: Secondary | ICD-10-CM | POA: Diagnosis not present

## 2023-06-17 DIAGNOSIS — K2970 Gastritis, unspecified, without bleeding: Secondary | ICD-10-CM | POA: Diagnosis not present

## 2023-06-17 DIAGNOSIS — D509 Iron deficiency anemia, unspecified: Secondary | ICD-10-CM | POA: Insufficient documentation

## 2023-06-17 DIAGNOSIS — D500 Iron deficiency anemia secondary to blood loss (chronic): Secondary | ICD-10-CM

## 2023-06-17 DIAGNOSIS — Z87891 Personal history of nicotine dependence: Secondary | ICD-10-CM | POA: Diagnosis not present

## 2023-06-17 LAB — CBC WITH DIFFERENTIAL (CANCER CENTER ONLY)
Abs Immature Granulocytes: 0.02 10*3/uL (ref 0.00–0.07)
Basophils Absolute: 0 10*3/uL (ref 0.0–0.1)
Basophils Relative: 1 %
Eosinophils Absolute: 0.1 10*3/uL (ref 0.0–0.5)
Eosinophils Relative: 3 %
HCT: 21.4 % — ABNORMAL LOW (ref 36.0–46.0)
Hemoglobin: 5.9 g/dL — CL (ref 12.0–15.0)
Immature Granulocytes: 0 %
Lymphocytes Relative: 18 %
Lymphs Abs: 1 10*3/uL (ref 0.7–4.0)
MCH: 22.4 pg — ABNORMAL LOW (ref 26.0–34.0)
MCHC: 27.6 g/dL — ABNORMAL LOW (ref 30.0–36.0)
MCV: 81.4 fL (ref 80.0–100.0)
Monocytes Absolute: 0.6 10*3/uL (ref 0.1–1.0)
Monocytes Relative: 12 %
Neutro Abs: 3.5 10*3/uL (ref 1.7–7.7)
Neutrophils Relative %: 66 %
Platelet Count: 539 10*3/uL — ABNORMAL HIGH (ref 150–400)
RBC: 2.63 MIL/uL — ABNORMAL LOW (ref 3.87–5.11)
RDW: 24.7 % — ABNORMAL HIGH (ref 11.5–15.5)
WBC Count: 5.3 10*3/uL (ref 4.0–10.5)
nRBC: 0 % (ref 0.0–0.2)

## 2023-06-17 LAB — HEPATIC FUNCTION PANEL
ALT: 13 U/L (ref 0–44)
AST: 14 U/L — ABNORMAL LOW (ref 15–41)
Albumin: 3.9 g/dL (ref 3.5–5.0)
Alkaline Phosphatase: 50 U/L (ref 38–126)
Bilirubin, Direct: <0.1 mg/dL (ref 0.0–0.2)
Total Bilirubin: 0.2 mg/dL — ABNORMAL LOW (ref 0.3–1.2)
Total Protein: 7 g/dL (ref 6.5–8.1)

## 2023-06-17 LAB — RETIC PANEL
Immature Retic Fract: 24.3 % — ABNORMAL HIGH (ref 2.3–15.9)
RBC.: 2.55 MIL/uL — ABNORMAL LOW (ref 3.87–5.11)
Retic Count, Absolute: 88.5 10*3/uL (ref 19.0–186.0)
Retic Ct Pct: 3.5 % — ABNORMAL HIGH (ref 0.4–3.1)
Reticulocyte Hemoglobin: 21.7 pg — ABNORMAL LOW (ref >27.9)

## 2023-06-17 LAB — IRON AND TIBC
Iron: 14 ug/dL — ABNORMAL LOW (ref 28–170)
Saturation Ratios: 4 % — ABNORMAL LOW (ref 10.4–31.8)
TIBC: 391 ug/dL (ref 250–450)
UIBC: 377 ug/dL

## 2023-06-17 LAB — FERRITIN: Ferritin: 11 ng/mL (ref 11–307)

## 2023-06-17 NOTE — Telephone Encounter (Deleted)
Liquid biopsy testing requested. Blood collected today and taken to [employer] box for shipping.    Financial application was submitted and approved. Pt's out of-of-pocket cost will be $0

## 2023-06-17 NOTE — Telephone Encounter (Signed)
  Received Critical lab:  Hemoglobin 5.9.   MD aware and ordered 2 units of blood for tomorrow. Blood bank notified and verbal order for prepare 2 units was given to [facility] in blood bank.

## 2023-06-17 NOTE — Telephone Encounter (Signed)
Entered in error

## 2023-06-18 ENCOUNTER — Inpatient Hospital Stay: Payer: Medicare Other

## 2023-06-18 ENCOUNTER — Encounter: Payer: Self-pay | Admitting: Oncology

## 2023-06-18 ENCOUNTER — Ambulatory Visit: Payer: Medicare Other | Admitting: Oncology

## 2023-06-18 VITALS — BP 148/75 | HR 76 | Temp 96.0°F | Resp 18 | Wt 107.8 lb

## 2023-06-18 DIAGNOSIS — D649 Anemia, unspecified: Secondary | ICD-10-CM | POA: Diagnosis not present

## 2023-06-18 DIAGNOSIS — K2950 Unspecified chronic gastritis without bleeding: Secondary | ICD-10-CM | POA: Diagnosis not present

## 2023-06-18 DIAGNOSIS — D500 Iron deficiency anemia secondary to blood loss (chronic): Secondary | ICD-10-CM | POA: Diagnosis not present

## 2023-06-18 DIAGNOSIS — R634 Abnormal weight loss: Secondary | ICD-10-CM

## 2023-06-18 DIAGNOSIS — K2970 Gastritis, unspecified, without bleeding: Secondary | ICD-10-CM | POA: Insufficient documentation

## 2023-06-18 DIAGNOSIS — D509 Iron deficiency anemia, unspecified: Secondary | ICD-10-CM | POA: Diagnosis not present

## 2023-06-18 DIAGNOSIS — R928 Other abnormal and inconclusive findings on diagnostic imaging of breast: Secondary | ICD-10-CM

## 2023-06-18 LAB — PREPARE RBC (CROSSMATCH)

## 2023-06-18 MED ORDER — DIPHENHYDRAMINE HCL 25 MG PO CAPS
50.0000 mg | ORAL_CAPSULE | Freq: Once | ORAL | Status: AC
  Administered 2023-06-18: 50 mg via ORAL
  Filled 2023-06-18: qty 2

## 2023-06-18 MED ORDER — ACETAMINOPHEN 325 MG PO TABS
650.0000 mg | ORAL_TABLET | Freq: Once | ORAL | Status: AC
  Administered 2023-06-18: 650 mg via ORAL
  Filled 2023-06-18: qty 2

## 2023-06-18 MED ORDER — SODIUM CHLORIDE 0.9% IV SOLUTION
250.0000 mL | Freq: Once | INTRAVENOUS | Status: AC
  Administered 2023-06-18: 250 mL via INTRAVENOUS
  Filled 2023-06-18: qty 250

## 2023-06-18 NOTE — Assessment & Plan Note (Signed)
Continue PPI, follow-up with GI

## 2023-06-18 NOTE — Assessment & Plan Note (Signed)
Not complaint and did not get diagnostic mammogram since 2022.  Will further discuss at next visit

## 2023-06-18 NOTE — Assessment & Plan Note (Signed)
Hb 5.9, transfuse 2 units of PRBC

## 2023-06-18 NOTE — Assessment & Plan Note (Signed)
Pending CT abdomen pancrease protocol for further evaluation.

## 2023-06-18 NOTE — Progress Notes (Addendum)
Hematology/Oncology Progress note [phone] [phone]         Patient Care Team: Jerl Mina, MD as PCP - General (Family Medicine) Theadore Nan, NP as Nurse Practitioner (Gastroenterology) Rickard Patience, MD as Consulting Physician (Hematology and Oncology)  CHIEF COMPLAINTS/REASON FOR VISIT:  Follow-up for anemia  ASSESSMENT & PLAN:   IDA (iron deficiency anemia) # severe iron deficiency anemia. Lab Results  Component Value Date   HGB 5.9 (LL) 06/17/2023   TIBC 391 06/17/2023   IRONPCTSAT 4 (L) 06/17/2023   FERRITIN 11 06/17/2023   Discussed about the rationale and potential side effects of IV Venofer treatments. She previously tolerated IV Venofer.  Plan IV venofer weekly x 4.   Suspect GI blood loss. Recommend patient to follow up with GI  Weight loss Pending CT abdomen pancrease protocol for further evaluation.   Gastritis Continue PPI, follow up with GI  Symptomatic anemia Hb 5.9, transfuse 2 units of PRBC  Abnormal screening mammogram Not complaint and did not get diagnostic mammogram since 2022.  Will further discuss at next visit    Repeat cbc hold tube in a few weeks.  Follow up in 2 months.   All questions were answered. The patient knows to call the clinic with any problems, questions or concerns.  Rickard Patience, MD, PhD [facility] Hematology Oncology 06/18/2023    HISTORY OF PRESENTING ILLNESS:   Linda Mcgee is a  67 y.o.  female with PMH listed below was seen in consultation at the request of  Jerl Mina, MD  for evaluation of anemia 09/12/2021, patient had a CBC done.  Hemoglobin 8, MCV 94.5, white count 7.2, platelet count 569, 06/13/2021, iron panel showed TIBC 492, iron saturation 4, 08/24/2021, iron panel showed TIBC 01[birth year]1929, iron saturation 5.  Patient reports that she is chronic anemia.  She has been taking oral iron supplementation for years.  Previously, she takes on daily basis and then she takes  once every 1 to 2 days.  After she was told that her iron levels were low in September/October 2022, she started back on taking iron supplementation daily.  + Fatigue, but she feels she is very functioning.  Denies shortness of breath.  Denies seeing any blood in the stool.  Denies family history of cancer.  INTERVAL HISTORY Linda Mcgee is a 67 y.o. female who has above history reviewed by me today presents to re-establish care for iron deficiency anemia.  She lost followed up since Feb 2023.  She was seen by GI recently. + weight loss, nausea. She takes PPI intermittently.  Stool is dark, she attributes to oral iron supplementation. No blood in stool.  + fatigue.    Review of Systems  Constitutional:  Positive for fatigue and unexpected weight change. Negative for appetite change, chills and fever.  HENT:   Negative for hearing loss and voice change.   Eyes:  Negative for eye problems.  Respiratory:  Negative for chest tightness and cough.   Cardiovascular:  Negative for chest pain.  Gastrointestinal:  Positive for nausea. Negative for abdominal distention, abdominal pain and blood in stool.  Endocrine: Negative for hot flashes.  Genitourinary:  Negative for difficulty urinating and frequency.   Musculoskeletal:  Negative for arthralgias.  Skin:  Negative for itching and rash.  Neurological:  Negative for extremity weakness.  Hematological:  Negative for adenopathy.  Psychiatric/Behavioral:  Negative for confusion.     MEDICAL HISTORY:  Past Medical History:  Diagnosis Date  Abdominal pain, epigastric    Alcohol-induced chronic pancreatitis (HCC)    Anemia    Asthma    Hepatic steatosis    History of pancreatitis    Hypertension    Hypokalemia    IDA (iron deficiency anemia) 11/01/2021   Iron deficiency anemia     SURGICAL HISTORY: Past Surgical History:  Procedure Laterality Date   COLONOSCOPY  06/2011   COLONOSCOPY WITH PROPOFOL N/A 12/11/2021   Procedure:  COLONOSCOPY WITH PROPOFOL;  Surgeon: Regis Bill, MD;  Location: [facility] ENDOSCOPY;  Service: Endoscopy;  Laterality: N/A;   ESOPHAGOGASTRODUODENOSCOPY (EGD) WITH PROPOFOL N/A 12/11/2021   Procedure: ESOPHAGOGASTRODUODENOSCOPY (EGD) WITH PROPOFOL;  Surgeon: Regis Bill, MD;  Location: [facility] ENDOSCOPY;  Service: Endoscopy;  Laterality: N/A;   LAPAROSCOPIC OOPHERECTOMY Left     SOCIAL HISTORY: Social History   Socioeconomic History   Marital status: Widowed    Spouse name: Not on file   Number of children: Not on file   Years of education: Not on file   Highest education level: Not on file  Occupational History   Not on file  Tobacco Use   Smoking status: Former   Smokeless tobacco: Never  Vaping Use   Vaping status: Never Used  Substance and Sexual Activity   Alcohol use: Yes    Comment: 4 bottles of wine weekly   Drug use: Never   Sexual activity: Not Currently  Other Topics Concern   Not on file  Social History Narrative   Not on file   Social Determinants of Health   Financial Resource Strain: Not on file  Food Insecurity: Not on file  Transportation Needs: Not on file  Physical Activity: Not on file  Stress: Not on file  Social Connections: Not on file  Intimate Partner Violence: Not on file    FAMILY HISTORY: Family History  Problem Relation Age of Onset   Hypertension Mother     ALLERGIES:  is allergic to lisinopril-hydrochlorothiazide.  MEDICATIONS:  Current Outpatient Medications  Medication Sig Dispense Refill   ADVAIR DISKUS 250-50 MCG/DOSE AEPB Inhale 1 puff into the lungs every 12 (twelve) hours.     albuterol (VENTOLIN HFA) 108 (90 Base) MCG/ACT inhaler Inhale 2 puffs into the lungs every 4 (four) hours as needed for wheezing or shortness of breath.     losartan-hydrochlorothiazide (HYZAAR) 100-25 MG tablet Take 1 tablet by mouth daily.     ondansetron (ZOFRAN-ODT) 4 MG disintegrating tablet Take 1 tablet (4 mg total) by mouth every 8  (eight) hours as needed for nausea or vomiting. 12 tablet 0   oxyCODONE-acetaminophen (PERCOCET) 5-325 MG tablet Take 1 tablet by mouth every 4 (four) hours as needed for severe pain. 12 tablet 0   triamcinolone (NASACORT) 55 MCG/ACT AERO nasal inhaler Place 2 sprays into the nose daily.     No current facility-administered medications for this visit.     PHYSICAL EXAMINATION: ECOG PERFORMANCE STATUS: 1 - Symptomatic but completely ambulatory Vitals:   06/18/23 0833  BP: (!) 148/75  Pulse: 76  Resp: 18  Temp: (!) 96 F (35.6 C)  SpO2: 100%    Filed Weights   06/18/23 0833  Weight: 107 lb 12.8 oz (48.9 kg)     Physical Exam Constitutional:      General: She is not in acute distress. HENT:     Head: Normocephalic and atraumatic.  Eyes:     General: No scleral icterus. Cardiovascular:     Rate and Rhythm: Normal rate.  Pulmonary:     Effort: Pulmonary effort is normal. No respiratory distress.     Breath sounds: No wheezing.  Abdominal:     General: Bowel sounds are normal. There is no distension.     Palpations: Abdomen is soft.     Comments: Epigastric tenderness  Musculoskeletal:        General: No deformity. Normal range of motion.     Cervical back: Normal range of motion and neck supple.  Skin:    General: Skin is warm and dry.     Findings: No erythema or rash.  Neurological:     Mental Status: She is alert and oriented to person, place, and time. Mental status is at baseline.     Cranial Nerves: No cranial nerve deficit.  Psychiatric:        Mood and Affect: Mood normal.     LABORATORY DATA:  I have reviewed the data as listed Lab Results  Component Value Date   WBC 5.3 06/17/2023   HGB 5.9 (LL) 06/17/2023   HCT 21.4 (L) 06/17/2023   MCV 81.4 06/17/2023   PLT 539 (H) 06/17/2023   Recent Labs    03/24/23 1603 05/29/23 1540 06/17/23 1452  NA 141 139  --   K 3.2* 3.6  --   CL 106 108  --   CO2 23 24  --   GLUCOSE 118* 112*  --   BUN 12 13   --   CREATININE 0.92 0.94  --   CALCIUM 10.3 10.5*  --   GFRNONAA >60 >60  --   PROT 7.1 7.2 7.0  ALBUMIN 4.0 3.8 3.9  AST 17 13* 14*  ALT 10 9 13   ALKPHOS 57 55 50  BILITOT 0.4 0.6 0.2*  BILIDIR  --   --  <0.1  IBILI  --   --  NOT CALCULATED   Iron/TIBC/Ferritin/ %Sat    Component Value Date/Time   IRON 14 (L) 06/17/2023 1452   TIBC 391 06/17/2023 1452   FERRITIN 11 06/17/2023 1452   IRONPCTSAT 4 (L) 06/17/2023 1452      RADIOGRAPHIC STUDIES: I have personally reviewed the radiological images as listed and agreed with the findings in the report. No results found.

## 2023-06-18 NOTE — Assessment & Plan Note (Addendum)
#   severe iron deficiency anemia. Lab Results  Component Value Date   HGB 5.9 (LL) 06/17/2023   TIBC 391 06/17/2023   IRONPCTSAT 4 (L) 06/17/2023   FERRITIN 11 06/17/2023   Discussed about the rationale and potential side effects of IV Venofer treatments. She previously tolerated IV Venofer.  Plan IV venofer weekly x 4.   Suspect GI blood loss. Recommend patient to follow up with GI

## 2023-06-18 NOTE — Addendum Note (Signed)
Addended by: Paulita Fujita on: 06/18/2023 09:13 AM   Modules accepted: Orders

## 2023-06-18 NOTE — Patient Instructions (Signed)

## 2023-06-19 ENCOUNTER — Ambulatory Visit
Admission: RE | Admit: 2023-06-19 | Discharge: 2023-06-19 | Disposition: A | Discharge status: Home / Self Care | Payer: Medicare Other | Source: Ambulatory Visit | Attending: Nurse Practitioner | Admitting: Nurse Practitioner

## 2023-06-19 DIAGNOSIS — D649 Anemia, unspecified: Secondary | ICD-10-CM | POA: Diagnosis present

## 2023-06-19 DIAGNOSIS — R112 Nausea with vomiting, unspecified: Secondary | ICD-10-CM | POA: Diagnosis present

## 2023-06-19 DIAGNOSIS — K760 Fatty (change of) liver, not elsewhere classified: Secondary | ICD-10-CM | POA: Diagnosis present

## 2023-06-19 DIAGNOSIS — R634 Abnormal weight loss: Secondary | ICD-10-CM

## 2023-06-19 DIAGNOSIS — R110 Nausea: Secondary | ICD-10-CM | POA: Diagnosis present

## 2023-06-19 DIAGNOSIS — Z8719 Personal history of other diseases of the digestive system: Secondary | ICD-10-CM | POA: Diagnosis present

## 2023-06-19 LAB — PREPARE RBC (CROSSMATCH)

## 2023-06-19 MED ORDER — IOHEXOL 300 MG/ML  SOLN
100.0000 mL | Freq: Once | INTRAMUSCULAR | Status: AC | PRN
  Administered 2023-06-19: 100 mL via INTRAVENOUS

## 2023-06-26 LAB — TYPE AND SCREEN
ABO/RH(D): O POS
Antibody Screen: POSITIVE
Unit division: 0
Unit division: 0
Unit division: 0
Unit division: 0

## 2023-06-26 LAB — BPAM RBC
Blood Product Expiration Date: 202409252359
Blood Product Expiration Date: 202410042359
Blood Product Expiration Date: 202410042359
Blood Product Expiration Date: 202410042359
ISSUE DATE / TIME: 202409040953
ISSUE DATE / TIME: 202409040957
ISSUE DATE / TIME: 202409041141
ISSUE DATE / TIME: 202409041517
Unit Type and Rh: 5100
Unit Type and Rh: 5100
Unit Type and Rh: 5100
Unit Type and Rh: 5100

## 2023-07-03 ENCOUNTER — Inpatient Hospital Stay: Payer: Medicare Other

## 2023-07-03 VITALS — BP 180/82 | HR 50 | Temp 95.9°F | Resp 18

## 2023-07-03 DIAGNOSIS — D500 Iron deficiency anemia secondary to blood loss (chronic): Secondary | ICD-10-CM

## 2023-07-03 DIAGNOSIS — D509 Iron deficiency anemia, unspecified: Secondary | ICD-10-CM | POA: Diagnosis not present

## 2023-07-03 MED ORDER — SODIUM CHLORIDE 0.9 % IV SOLN
200.0000 mg | Freq: Once | INTRAVENOUS | Status: AC
  Administered 2023-07-03: 200 mg via INTRAVENOUS
  Filled 2023-07-03: qty 200

## 2023-07-03 MED ORDER — SODIUM CHLORIDE 0.9 % IV SOLN
Freq: Once | INTRAVENOUS | Status: AC
  Filled 2023-07-03: qty 250

## 2023-07-03 NOTE — Progress Notes (Signed)
Patient declined to wait the 30 minutes for post iron infusion observation today. Tolerated infusion well. VSS. 

## 2023-07-10 ENCOUNTER — Inpatient Hospital Stay: Payer: Medicare Other

## 2023-07-10 VITALS — BP 133/85 | HR 62 | Temp 97.1°F | Resp 18

## 2023-07-10 DIAGNOSIS — D509 Iron deficiency anemia, unspecified: Secondary | ICD-10-CM | POA: Diagnosis not present

## 2023-07-10 DIAGNOSIS — D500 Iron deficiency anemia secondary to blood loss (chronic): Secondary | ICD-10-CM

## 2023-07-10 MED ORDER — INFLUENZA VAC A&B SURF ANT ADJ 0.5 ML IM SUSY
0.5000 mL | PREFILLED_SYRINGE | Freq: Once | INTRAMUSCULAR | Status: AC
  Administered 2023-07-10: 0.5 mL via INTRAMUSCULAR
  Filled 2023-07-10: qty 0.5

## 2023-07-10 MED ORDER — SODIUM CHLORIDE 0.9 % IV SOLN
Freq: Once | INTRAVENOUS | Status: AC
  Filled 2023-07-10: qty 250

## 2023-07-10 MED ORDER — SODIUM CHLORIDE 0.9 % IV SOLN
200.0000 mg | Freq: Once | INTRAVENOUS | Status: AC
  Administered 2023-07-10: 200 mg via INTRAVENOUS
  Filled 2023-07-10: qty 200

## 2023-07-10 NOTE — Patient Instructions (Signed)
[facility]  Discharge Instructions: Thank you for choosing [facility] to provide your oncology and hematology care.  If you have a lab appointment with the Cancer Center, please go directly to the Cancer Center and check in at the registration area.  Wear comfortable clothing and clothing appropriate for easy access to any Portacath or PICC line.   We strive to give you quality time with your provider. You may need to reschedule your appointment if you arrive late (15 or more minutes).  Arriving late affects you and other patients whose appointments are after yours.  Also, if you miss three or more appointments without notifying the office, you may be dismissed from the clinic at the provider's discretion.      For prescription refill requests, have your pharmacy contact our office and allow 72 hours for refills to be completed.    Today you received the following chemotherapy and/or immunotherapy agents Venofer and Influenza vaccine.      To help prevent nausea and vomiting after your treatment, we encourage you to take your nausea medication as directed.  BELOW ARE SYMPTOMS THAT SHOULD BE REPORTED IMMEDIATELY: *FEVER GREATER THAN 100.4 F (38 C) OR HIGHER *CHILLS OR SWEATING *NAUSEA AND VOMITING THAT IS NOT CONTROLLED WITH YOUR NAUSEA MEDICATION *UNUSUAL SHORTNESS OF BREATH *UNUSUAL BRUISING OR BLEEDING *URINARY PROBLEMS (pain or burning when urinating, or frequent urination) *BOWEL PROBLEMS (unusual diarrhea, constipation, pain near the anus) TENDERNESS IN MOUTH AND THROAT WITH OR WITHOUT PRESENCE OF ULCERS (sore throat, sores in mouth, or a toothache) UNUSUAL RASH, SWELLING OR PAIN  UNUSUAL VAGINAL DISCHARGE OR ITCHING   Items with * indicate a potential emergency and should be followed up as soon as possible or go to the Emergency Department if any problems should occur.  Please show the CHEMOTHERAPY ALERT CARD or IMMUNOTHERAPY ALERT  CARD at check-in to the Emergency Department and triage nurse.  Should you have questions after your visit or need to cancel or reschedule your appointment, please contact [place]  [phone] and follow the prompts.  Office hours are 8:00 a.m. to 4:30 p.m. Monday - Friday. Please note that voicemails left after 4:00 p.m. may not be returned until the following business day.  We are closed weekends and major holidays. You have access to a nurse at all times for urgent questions. Please call the main number to the clinic [phone] and follow the prompts.  For any non-urgent questions, you may also contact your provider using MyChart. We now offer e-Visits for anyone 31 and older to request care online for non-urgent symptoms. For details visit [email or web address].   Also download the MyChart app! Go to the app store, search "MyChart", open the app, select , and log in with your MyChart username and password.

## 2023-07-17 ENCOUNTER — Inpatient Hospital Stay: Payer: Medicare Other | Attending: Oncology

## 2023-07-17 VITALS — BP 154/84 | HR 68 | Temp 97.8°F | Resp 16

## 2023-07-17 DIAGNOSIS — D500 Iron deficiency anemia secondary to blood loss (chronic): Secondary | ICD-10-CM

## 2023-07-17 DIAGNOSIS — D509 Iron deficiency anemia, unspecified: Secondary | ICD-10-CM | POA: Diagnosis present

## 2023-07-17 MED ORDER — SODIUM CHLORIDE 0.9 % IV SOLN
Freq: Once | INTRAVENOUS | Status: AC
  Filled 2023-07-17: qty 250

## 2023-07-17 MED ORDER — SODIUM CHLORIDE 0.9 % IV SOLN
200.0000 mg | Freq: Once | INTRAVENOUS | Status: AC
  Administered 2023-07-17: 200 mg via INTRAVENOUS
  Filled 2023-07-17: qty 200

## 2023-07-17 NOTE — Progress Notes (Signed)
Patient declined 30 minute observation. Vs stable. Patient tolerated well

## 2023-07-17 NOTE — Patient Instructions (Signed)
Iron Sucrose Injection What is this medication? IRON SUCROSE (EYE ern SOO krose) treats low levels of iron (iron deficiency anemia) in people with kidney disease. Iron is a mineral that plays an important role in making red blood cells, which carry oxygen from your lungs to the rest of your body. This medicine may be used for other purposes; ask your health care provider or pharmacist if you have questions. COMMON BRAND NAME(S): Venofer What should I tell my care team before I take this medication? They need to know if you have any of these conditions: Anemia not caused by low iron levels Heart disease High levels of iron in the blood Kidney disease Liver disease An unusual or allergic reaction to iron, other medications, foods, dyes, or preservatives Pregnant or trying to get pregnant Breastfeeding How should I use this medication? This medication is for infusion into a vein. It is given in a hospital or clinic setting. Talk to your care team about the use of this medication in children. While this medication may be prescribed for children as young as 2 years for selected conditions, precautions do apply. Overdosage: If you think you have taken too much of this medicine contact a poison control center or emergency room at once. NOTE: This medicine is only for you. Do not share this medicine with others. What if I miss a dose? Keep appointments for follow-up doses. It is important not to miss your dose. Call your care team if you are unable to keep an appointment. What may interact with this medication? Do not take this medication with any of the following: Deferoxamine Dimercaprol Other iron products This medication may also interact with the following: Chloramphenicol Deferasirox This list may not describe all possible interactions. Give your health care provider a list of all the medicines, herbs, non-prescription drugs, or dietary supplements you use. Also tell them if you smoke,  drink alcohol, or use illegal drugs. Some items may interact with your medicine. What should I watch for while using this medication? Visit your care team regularly. Tell your care team if your symptoms do not start to get better or if they get worse. You may need blood work done while you are taking this medication. You may need to follow a special diet. Talk to your care team. Foods that contain iron include: whole grains/cereals, dried fruits, beans, or peas, leafy green vegetables, and organ meats (liver, kidney). What side effects may I notice from receiving this medication? Side effects that you should report to your care team as soon as possible: Allergic reactions--skin rash, itching, hives, swelling of the face, lips, tongue, or throat Low blood pressure--dizziness, feeling faint or lightheaded, blurry vision Shortness of breath Side effects that usually do not require medical attention (report to your care team if they continue or are bothersome): Flushing Headache Joint pain Muscle pain Nausea Pain, redness, or irritation at injection site This list may not describe all possible side effects. Call your doctor for medical advice about side effects. You may report side effects to FDA at 1-800-FDA-1088. Where should I keep my medication? This medication is given in a hospital or clinic. It will not be stored at home. NOTE: This sheet is a summary. It may not cover all possible information. If you have questions about this medicine, talk to your doctor, pharmacist, or health care provider.  2024 Elsevier/Gold Standard (2023-03-07 00:00:00)

## 2023-07-22 ENCOUNTER — Encounter: Admission: RE | Payer: Self-pay | Source: Home / Self Care

## 2023-07-22 ENCOUNTER — Ambulatory Visit: Admission: RE | Admit: 2023-07-22 | Payer: Medicare Other | Source: Home / Self Care

## 2023-07-22 SURGERY — ESOPHAGOGASTRODUODENOSCOPY (EGD) WITH PROPOFOL
Anesthesia: General

## 2023-07-24 ENCOUNTER — Inpatient Hospital Stay: Payer: Medicare Other

## 2023-07-24 VITALS — BP 137/72 | HR 61 | Temp 97.6°F | Resp 16

## 2023-07-24 DIAGNOSIS — D509 Iron deficiency anemia, unspecified: Secondary | ICD-10-CM | POA: Diagnosis not present

## 2023-07-24 DIAGNOSIS — D500 Iron deficiency anemia secondary to blood loss (chronic): Secondary | ICD-10-CM

## 2023-07-24 LAB — CBC WITH DIFFERENTIAL (CANCER CENTER ONLY)
Abs Immature Granulocytes: 0.03 10*3/uL (ref 0.00–0.07)
Basophils Absolute: 0 10*3/uL (ref 0.0–0.1)
Basophils Relative: 1 %
Eosinophils Absolute: 0.4 10*3/uL (ref 0.0–0.5)
Eosinophils Relative: 7 %
HCT: 35.6 % — ABNORMAL LOW (ref 36.0–46.0)
Hemoglobin: 11 g/dL — ABNORMAL LOW (ref 12.0–15.0)
Immature Granulocytes: 1 %
Lymphocytes Relative: 13 %
Lymphs Abs: 0.8 10*3/uL (ref 0.7–4.0)
MCH: 26 pg (ref 26.0–34.0)
MCHC: 30.9 g/dL (ref 30.0–36.0)
MCV: 84.2 fL (ref 80.0–100.0)
Monocytes Absolute: 0.8 10*3/uL (ref 0.1–1.0)
Monocytes Relative: 12 %
Neutro Abs: 4.2 10*3/uL (ref 1.7–7.7)
Neutrophils Relative %: 66 %
Platelet Count: 409 10*3/uL — ABNORMAL HIGH (ref 150–400)
RBC: 4.23 MIL/uL (ref 3.87–5.11)
RDW: 24.6 % — ABNORMAL HIGH (ref 11.5–15.5)
WBC Count: 6.2 10*3/uL (ref 4.0–10.5)
nRBC: 0 % (ref 0.0–0.2)

## 2023-07-24 LAB — SAMPLE TO BLOOD BANK

## 2023-07-24 MED ORDER — SODIUM CHLORIDE 0.9 % IV SOLN
200.0000 mg | Freq: Once | INTRAVENOUS | Status: AC
  Administered 2023-07-24: 200 mg via INTRAVENOUS
  Filled 2023-07-24: qty 200

## 2023-07-24 MED ORDER — SODIUM CHLORIDE 0.9 % IV SOLN
Freq: Once | INTRAVENOUS | Status: AC
  Filled 2023-07-24: qty 250

## 2023-07-31 ENCOUNTER — Other Ambulatory Visit: Payer: Self-pay | Admitting: Nurse Practitioner

## 2023-07-31 DIAGNOSIS — K862 Cyst of pancreas: Secondary | ICD-10-CM

## 2023-07-31 DIAGNOSIS — Z8719 Personal history of other diseases of the digestive system: Secondary | ICD-10-CM

## 2023-07-31 DIAGNOSIS — D509 Iron deficiency anemia, unspecified: Secondary | ICD-10-CM

## 2023-08-05 ENCOUNTER — Ambulatory Visit
Admission: RE | Admit: 2023-08-05 | Discharge: 2023-08-05 | Disposition: A | Discharge status: Home / Self Care | Payer: Medicare Other | Source: Ambulatory Visit | Attending: Nurse Practitioner | Admitting: Nurse Practitioner

## 2023-08-05 DIAGNOSIS — Z8719 Personal history of other diseases of the digestive system: Secondary | ICD-10-CM | POA: Insufficient documentation

## 2023-08-05 DIAGNOSIS — K862 Cyst of pancreas: Secondary | ICD-10-CM | POA: Diagnosis present

## 2023-08-05 DIAGNOSIS — D509 Iron deficiency anemia, unspecified: Secondary | ICD-10-CM | POA: Insufficient documentation

## 2023-08-05 MED ORDER — IOHEXOL 300 MG/ML  SOLN
75.0000 mL | Freq: Once | INTRAMUSCULAR | Status: AC | PRN
  Administered 2023-08-05: 80 mL via INTRAVENOUS

## 2023-08-18 ENCOUNTER — Inpatient Hospital Stay: Payer: Medicare Other | Attending: Oncology

## 2023-08-18 DIAGNOSIS — R5383 Other fatigue: Secondary | ICD-10-CM | POA: Insufficient documentation

## 2023-08-18 DIAGNOSIS — D509 Iron deficiency anemia, unspecified: Secondary | ICD-10-CM | POA: Diagnosis present

## 2023-08-18 DIAGNOSIS — Z87891 Personal history of nicotine dependence: Secondary | ICD-10-CM | POA: Insufficient documentation

## 2023-08-18 DIAGNOSIS — D500 Iron deficiency anemia secondary to blood loss (chronic): Secondary | ICD-10-CM

## 2023-08-18 LAB — RETIC PANEL
Immature Retic Fract: 14.7 % (ref 2.3–15.9)
RBC.: 3.99 MIL/uL (ref 3.87–5.11)
Retic Count, Absolute: 57.1 10*3/uL (ref 19.0–186.0)
Retic Ct Pct: 1.4 % (ref 0.4–3.1)
Reticulocyte Hemoglobin: 32.6 pg (ref >27.9)

## 2023-08-18 LAB — CBC WITH DIFFERENTIAL (CANCER CENTER ONLY)
Abs Immature Granulocytes: 0.02 10*3/uL (ref 0.00–0.07)
Basophils Absolute: 0 10*3/uL (ref 0.0–0.1)
Basophils Relative: 0 %
Eosinophils Absolute: 0.2 10*3/uL (ref 0.0–0.5)
Eosinophils Relative: 3 %
HCT: 35.2 % — ABNORMAL LOW (ref 36.0–46.0)
Hemoglobin: 11 g/dL — ABNORMAL LOW (ref 12.0–15.0)
Immature Granulocytes: 0 %
Lymphocytes Relative: 17 %
Lymphs Abs: 0.9 10*3/uL (ref 0.7–4.0)
MCH: 27.5 pg (ref 26.0–34.0)
MCHC: 31.3 g/dL (ref 30.0–36.0)
MCV: 88 fL (ref 80.0–100.0)
Monocytes Absolute: 0.7 10*3/uL (ref 0.1–1.0)
Monocytes Relative: 13 %
Neutro Abs: 3.6 10*3/uL (ref 1.7–7.7)
Neutrophils Relative %: 67 %
Platelet Count: 368 10*3/uL (ref 150–400)
RBC: 4 MIL/uL (ref 3.87–5.11)
RDW: 23.7 % — ABNORMAL HIGH (ref 11.5–15.5)
WBC Count: 5.4 10*3/uL (ref 4.0–10.5)
nRBC: 0 % (ref 0.0–0.2)

## 2023-08-18 LAB — IRON AND TIBC
Iron: 81 ug/dL (ref 28–170)
Saturation Ratios: 24 % (ref 10.4–31.8)
TIBC: 337 ug/dL (ref 250–450)
UIBC: 256 ug/dL

## 2023-08-18 LAB — FERRITIN: Ferritin: 68 ng/mL (ref 11–307)

## 2023-08-19 ENCOUNTER — Inpatient Hospital Stay (HOSPITAL_BASED_OUTPATIENT_CLINIC_OR_DEPARTMENT_OTHER): Payer: Medicare Other | Admitting: Oncology

## 2023-08-19 ENCOUNTER — Encounter: Payer: Self-pay | Admitting: Oncology

## 2023-08-19 ENCOUNTER — Inpatient Hospital Stay: Payer: Medicare Other

## 2023-08-19 VITALS — BP 145/75 | HR 82 | Temp 96.4°F | Resp 16 | Ht 60.0 in | Wt 110.0 lb

## 2023-08-19 DIAGNOSIS — D500 Iron deficiency anemia secondary to blood loss (chronic): Secondary | ICD-10-CM | POA: Diagnosis not present

## 2023-08-19 DIAGNOSIS — R928 Other abnormal and inconclusive findings on diagnostic imaging of breast: Secondary | ICD-10-CM | POA: Diagnosis not present

## 2023-08-19 DIAGNOSIS — D509 Iron deficiency anemia, unspecified: Secondary | ICD-10-CM | POA: Diagnosis not present

## 2023-08-19 NOTE — Progress Notes (Signed)
Hematology/Oncology Progress note [phone] [phone]         Patient Care Team: Jerl Mina, MD as PCP - General (Family Medicine) Theadore Nan, NP as Nurse Practitioner (Gastroenterology) Rickard Patience, MD as Consulting Physician (Hematology and Oncology)  CHIEF COMPLAINTS/REASON FOR VISIT:  Follow-up for anemia  ASSESSMENT & PLAN:   IDA (iron deficiency anemia) # severe iron deficiency anemia. Lab Results  Component Value Date   HGB 11.0 (L) 08/18/2023   TIBC 337 08/18/2023   IRONPCTSAT 24 08/18/2023   FERRITIN 68 08/18/2023   Hold off IV Venofer. Recommend oral iron supplementation daily.  Suspect GI blood loss. Recommend patient to follow up with GI  Abnormal screening mammogram Not complaint and did not get diagnostic mammogram since 2022.  Recommend mammogram   Follow up in 6 months.   All questions were answered. The patient knows to call the clinic with any problems, questions or concerns.  Rickard Patience, MD, PhD [facility] Hematology Oncology 08/19/2023    HISTORY OF PRESENTING ILLNESS:   Linda Mcgee is a  67 y.o.  female with PMH listed below was seen in consultation at the request of  Jerl Mina, MD  for evaluation of anemia 09/12/2021, patient had a CBC done.  Hemoglobin 8, MCV 94.5, white count 7.2, platelet count 569, 06/13/2021, iron panel showed TIBC 492, iron saturation 4, 08/24/2021, iron panel showed TIBC [birth year]13[birth year]1929, iron saturation 5.  Patient reports that she is chronic anemia.  She has been taking oral iron supplementation for years.  Previously, she takes on daily basis and then she takes once every 1 to 2 days.  After she was told that her iron levels were low in September/October 2022, she started back on taking iron supplementation daily.  + Fatigue, but she feels she is very functioning.  Denies shortness of breath.  Denies seeing any blood in the stool.  Denies family history of cancer.  INTERVAL  HISTORY Linda Mcgee is a 67 y.o. female who has above history reviewed by me today presents to re-establish care for iron deficiency anemia.  Patient is status post IV Venofer treatments.  She reports feeling much better today.  Fatigue has improved. Weight is stable since last visit.  Review of Systems  Constitutional:  Positive for fatigue. Negative for appetite change, chills, fever and unexpected weight change.  HENT:   Negative for hearing loss and voice change.   Eyes:  Negative for eye problems.  Respiratory:  Negative for chest tightness and cough.   Cardiovascular:  Negative for chest pain.  Gastrointestinal:  Negative for abdominal distention, abdominal pain, blood in stool and nausea.  Endocrine: Negative for hot flashes.  Genitourinary:  Negative for difficulty urinating and frequency.   Musculoskeletal:  Negative for arthralgias.  Skin:  Negative for itching and rash.  Neurological:  Negative for extremity weakness.  Hematological:  Negative for adenopathy.  Psychiatric/Behavioral:  Negative for confusion.     MEDICAL HISTORY:  Past Medical History:  Diagnosis Date   Abdominal pain, epigastric    Alcohol-induced chronic pancreatitis (HCC)    Anemia    Asthma    Hepatic steatosis    History of pancreatitis    Hypertension    Hypokalemia    IDA (iron deficiency anemia) 11/01/2021   Iron deficiency anemia     SURGICAL HISTORY: Past Surgical History:  Procedure Laterality Date   COLONOSCOPY  06/2011   COLONOSCOPY WITH PROPOFOL N/A 12/11/2021   Procedure: COLONOSCOPY WITH PROPOFOL;  Surgeon: Regis Bill, MD;  Location: [facility] ENDOSCOPY;  Service: Endoscopy;  Laterality: N/A;   ESOPHAGOGASTRODUODENOSCOPY (EGD) WITH PROPOFOL N/A 12/11/2021   Procedure: ESOPHAGOGASTRODUODENOSCOPY (EGD) WITH PROPOFOL;  Surgeon: Regis Bill, MD;  Location: [facility] ENDOSCOPY;  Service: Endoscopy;  Laterality: N/A;   LAPAROSCOPIC OOPHERECTOMY Left     SOCIAL  HISTORY: Social History   Socioeconomic History   Marital status: Widowed    Spouse name: Not on file   Number of children: Not on file   Years of education: Not on file   Highest education level: Not on file  Occupational History   Not on file  Tobacco Use   Smoking status: Former   Smokeless tobacco: Never  Vaping Use   Vaping status: Never Used  Substance and Sexual Activity   Alcohol use: Yes    Comment: 4 bottles of wine weekly   Drug use: Never   Sexual activity: Not Currently  Other Topics Concern   Not on file  Social History Narrative   Not on file   Social Determinants of Health   Financial Resource Strain: Not on file  Food Insecurity: Not on file  Transportation Needs: Not on file  Physical Activity: Not on file  Stress: Not on file  Social Connections: Not on file  Intimate Partner Violence: Not on file    FAMILY HISTORY: Family History  Problem Relation Age of Onset   Hypertension Mother     ALLERGIES:  is allergic to lisinopril-hydrochlorothiazide.  MEDICATIONS:  Current Outpatient Medications  Medication Sig Dispense Refill   ADVAIR DISKUS 250-50 MCG/DOSE AEPB Inhale 1 puff into the lungs every 12 (twelve) hours.     albuterol (VENTOLIN HFA) 108 (90 Base) MCG/ACT inhaler Inhale 2 puffs into the lungs every 4 (four) hours as needed for wheezing or shortness of breath.     losartan-hydrochlorothiazide (HYZAAR) 100-25 MG tablet Take 1 tablet by mouth daily.     omeprazole (PRILOSEC) 20 MG capsule Take by mouth.     ondansetron (ZOFRAN-ODT) 4 MG disintegrating tablet Take 1 tablet (4 mg total) by mouth every 8 (eight) hours as needed for nausea or vomiting. 12 tablet 0   triamcinolone (NASACORT) 55 MCG/ACT AERO nasal inhaler Place 2 sprays into the nose daily.     CREON 12000-38000 units CPEP capsule Take 12,000 Units by mouth 3 (three) times daily before meals.     oxyCODONE-acetaminophen (PERCOCET) 5-325 MG tablet Take 1 tablet by mouth every 4  (four) hours as needed for severe pain. (Patient not taking: Reported on 08/19/2023) 12 tablet 0   No current facility-administered medications for this visit.     PHYSICAL EXAMINATION: ECOG PERFORMANCE STATUS: 1 - Symptomatic but completely ambulatory Vitals:   08/19/23 1345 08/19/23 1411  BP: (!) 84/73 (!) 145/75  Pulse: 82   Resp: 16   Temp: (!) 96.4 F (35.8 C)   SpO2: 96%     Filed Weights   08/19/23 1345  Weight: 110 lb (49.9 kg)     Physical Exam Constitutional:      General: She is not in acute distress. HENT:     Head: Normocephalic and atraumatic.  Eyes:     General: No scleral icterus. Cardiovascular:     Rate and Rhythm: Normal rate.  Pulmonary:     Effort: Pulmonary effort is normal. No respiratory distress.     Breath sounds: No wheezing.  Abdominal:     General: Bowel sounds are normal. There is no distension.  Palpations: Abdomen is soft.     Comments: Epigastric tenderness  Musculoskeletal:        General: No deformity. Normal range of motion.     Cervical back: Normal range of motion and neck supple.  Skin:    General: Skin is warm and dry.     Findings: No erythema or rash.  Neurological:     Mental Status: She is alert and oriented to person, place, and time. Mental status is at baseline.     Cranial Nerves: No cranial nerve deficit.  Psychiatric:        Mood and Affect: Mood normal.     LABORATORY DATA:  I have reviewed the data as listed Lab Results  Component Value Date   WBC 5.4 08/18/2023   HGB 11.0 (L) 08/18/2023   HCT 35.2 (L) 08/18/2023   MCV 88.0 08/18/2023   PLT 368 08/18/2023   Recent Labs    03/24/23 1603 05/29/23 1540 06/17/23 1452  NA 141 139  --   K 3.2* 3.6  --   CL 106 108  --   CO2 23 24  --   GLUCOSE 118* 112*  --   BUN 12 13  --   CREATININE 0.92 0.94  --   CALCIUM 10.3 10.5*  --   GFRNONAA >60 >60  --   PROT 7.1 7.2 7.0  ALBUMIN 4.0 3.8 3.9  AST 17 13* 14*  ALT 10 9 13   ALKPHOS 57 55 50   BILITOT 0.4 0.6 0.2*  BILIDIR  --   --  <0.1  IBILI  --   --  NOT CALCULATED   Iron/TIBC/Ferritin/ %Sat    Component Value Date/Time   IRON 81 08/18/2023 1147   TIBC 337 08/18/2023 1147   FERRITIN 68 08/18/2023 1147   IRONPCTSAT 24 08/18/2023 1147      RADIOGRAPHIC STUDIES: I have personally reviewed the radiological images as listed and agreed with the findings in the report. No results found.

## 2023-08-19 NOTE — Assessment & Plan Note (Signed)
Not complaint and did not get diagnostic mammogram since 2022.  Recommend mammogram

## 2023-08-19 NOTE — Assessment & Plan Note (Addendum)
#   severe iron deficiency anemia. Lab Results  Component Value Date   HGB 11.0 (L) 08/18/2023   TIBC 337 08/18/2023   IRONPCTSAT 24 08/18/2023   FERRITIN 68 08/18/2023   Hold off IV Venofer. Recommend oral iron supplementation daily.  Suspect GI blood loss. Recommend patient to follow up with GI

## 2023-08-20 ENCOUNTER — Telehealth: Payer: Self-pay

## 2023-08-20 DIAGNOSIS — R928 Other abnormal and inconclusive findings on diagnostic imaging of breast: Secondary | ICD-10-CM

## 2023-08-20 NOTE — Telephone Encounter (Signed)
-----   Message from Rickard Patience sent at 08/19/2023  8:33 PM EST ----- Patient had abnormal mammogram in 2022.  Please let her know that I recommend patient to get a bilateral diagnostic mammogram.  If she agrees please arrange.  Thank you

## 2023-08-20 NOTE — Telephone Encounter (Signed)
Pt informed of MD recommendation for diag mammo.   Please schedule Mammo and inform pt of appt details.

## 2023-09-03 ENCOUNTER — Ambulatory Visit
Admission: RE | Admit: 2023-09-03 | Discharge: 2023-09-03 | Disposition: A | Discharge status: Home / Self Care | Payer: Medicare Other | Source: Ambulatory Visit | Attending: Oncology | Admitting: Oncology

## 2023-09-03 DIAGNOSIS — R928 Other abnormal and inconclusive findings on diagnostic imaging of breast: Secondary | ICD-10-CM | POA: Diagnosis present

## 2023-10-30 ENCOUNTER — Encounter: Payer: Self-pay | Admitting: *Deleted

## 2023-11-07 ENCOUNTER — Ambulatory Visit: Admission: RE | Admit: 2023-11-07 | Payer: Medicare Other | Source: Home / Self Care

## 2023-11-07 SURGERY — ESOPHAGOGASTRODUODENOSCOPY (EGD) WITH PROPOFOL
Anesthesia: General

## 2024-02-16 ENCOUNTER — Inpatient Hospital Stay: Payer: Medicare Other | Attending: Oncology

## 2024-02-19 ENCOUNTER — Inpatient Hospital Stay: Payer: Medicare Other

## 2024-02-19 ENCOUNTER — Inpatient Hospital Stay

## 2024-02-19 ENCOUNTER — Inpatient Hospital Stay: Payer: Medicare Other | Admitting: Oncology

## 2024-02-19 ENCOUNTER — Encounter: Payer: Self-pay | Admitting: Oncology

## 2024-03-31 ENCOUNTER — Inpatient Hospital Stay: Attending: Oncology

## 2024-03-31 DIAGNOSIS — D509 Iron deficiency anemia, unspecified: Secondary | ICD-10-CM | POA: Insufficient documentation

## 2024-03-31 DIAGNOSIS — D500 Iron deficiency anemia secondary to blood loss (chronic): Secondary | ICD-10-CM

## 2024-03-31 LAB — CBC WITH DIFFERENTIAL (CANCER CENTER ONLY)
Abs Immature Granulocytes: 0.03 10*3/uL (ref 0.00–0.07)
Basophils Absolute: 0 10*3/uL (ref 0.0–0.1)
Basophils Relative: 0 %
Eosinophils Absolute: 0.1 10*3/uL (ref 0.0–0.5)
Eosinophils Relative: 2 %
HCT: 31.9 % — ABNORMAL LOW (ref 36.0–46.0)
Hemoglobin: 10.2 g/dL — ABNORMAL LOW (ref 12.0–15.0)
Immature Granulocytes: 1 %
Lymphocytes Relative: 30 %
Lymphs Abs: 1.1 10*3/uL (ref 0.7–4.0)
MCH: 29.5 pg (ref 26.0–34.0)
MCHC: 32 g/dL (ref 30.0–36.0)
MCV: 92.2 fL (ref 80.0–100.0)
Monocytes Absolute: 0.6 10*3/uL (ref 0.1–1.0)
Monocytes Relative: 16 %
Neutro Abs: 2 10*3/uL (ref 1.7–7.7)
Neutrophils Relative %: 51 %
Platelet Count: 314 10*3/uL (ref 150–400)
RBC: 3.46 MIL/uL — ABNORMAL LOW (ref 3.87–5.11)
RDW: 15.2 % (ref 11.5–15.5)
WBC Count: 3.8 10*3/uL — ABNORMAL LOW (ref 4.0–10.5)
nRBC: 0 % (ref 0.0–0.2)

## 2024-03-31 LAB — IRON AND TIBC
Iron: 85 ug/dL (ref 28–170)
Saturation Ratios: 19 % (ref 10.4–31.8)
TIBC: 449 ug/dL (ref 250–450)
UIBC: 364 ug/dL

## 2024-03-31 LAB — RETIC PANEL
Immature Retic Fract: 15.3 % (ref 2.3–15.9)
RBC.: 3.43 MIL/uL — ABNORMAL LOW (ref 3.87–5.11)
Retic Count, Absolute: 60.7 10*3/uL (ref 19.0–186.0)
Retic Ct Pct: 1.8 % (ref 0.4–3.1)
Reticulocyte Hemoglobin: 32.3 pg (ref >27.9)

## 2024-03-31 LAB — FERRITIN: Ferritin: 4 ng/mL — ABNORMAL LOW (ref 11–307)

## 2024-04-02 ENCOUNTER — Inpatient Hospital Stay: Admitting: Oncology

## 2024-04-02 ENCOUNTER — Inpatient Hospital Stay

## 2024-04-02 ENCOUNTER — Encounter: Payer: Self-pay | Admitting: Oncology

## 2024-04-02 VITALS — BP 156/85 | HR 78 | Temp 98.6°F | Resp 20 | Wt 113.7 lb

## 2024-04-02 VITALS — BP 157/84 | HR 57 | Temp 95.0°F | Resp 19

## 2024-04-02 DIAGNOSIS — D509 Iron deficiency anemia, unspecified: Secondary | ICD-10-CM | POA: Diagnosis not present

## 2024-04-02 DIAGNOSIS — D500 Iron deficiency anemia secondary to blood loss (chronic): Secondary | ICD-10-CM

## 2024-04-02 MED ORDER — IRON SUCROSE 20 MG/ML IV SOLN
200.0000 mg | Freq: Once | INTRAVENOUS | Status: AC
  Administered 2024-04-02: 200 mg via INTRAVENOUS

## 2024-04-02 MED ORDER — SODIUM CHLORIDE 0.9% FLUSH
10.0000 mL | Freq: Once | INTRAVENOUS | Status: AC | PRN
  Administered 2024-04-02: 10 mL
  Filled 2024-04-02: qty 10

## 2024-04-02 NOTE — Progress Notes (Signed)
 Hematology/Oncology Progress note [phone] [phone]         Patient Care Team: Lyle San, MD as PCP - General (Family Medicine) Rhina Center, NP (Inactive) as Nurse Practitioner (Gastroenterology) Timmy Forbes, MD as Consulting Physician (Hematology and Oncology)  CHIEF COMPLAINTS/REASON FOR VISIT:  Follow-up for anemia  ASSESSMENT & PLAN:   IDA (iron  deficiency anemia) # severe iron  deficiency anemia. Lab Results  Component Value Date   HGB 10.2 (L) 03/31/2024   TIBC 449 03/31/2024   IRONPCTSAT 19 03/31/2024   FERRITIN 4 (L) 03/31/2024   Recommend IV venofer  weekly x 4 Suspect GI blood loss. Recommend patient to follow up with GI- Dr. Mamie Searles   Follow up in 3 months.   All questions were answered. The patient knows to call the clinic with any problems, questions or concerns.  Timmy Forbes, MD, PhD [facility] Hematology Oncology 04/02/2024    HISTORY OF PRESENTING ILLNESS:   Linda Mcgee is a  68 y.o.  female with PMH listed below was seen in consultation at the request of  Lyle San, MD  for evaluation of anemia 09/12/2021, patient had a CBC done.  Hemoglobin 8, MCV 94.5, white count 7.2, platelet count 569, 06/13/2021, iron  panel showed TIBC 492, iron  saturation 4, 08/24/2021, iron  panel showed TIBC 01/13/1929, iron  saturation 5.  Patient reports that she is chronic anemia.  She has been taking oral iron  supplementation for years.  Previously, she takes on daily basis and then she takes once every 1 to 2 days.  After she was told that her iron  levels were low in September/October 2022, she started back on taking iron  supplementation daily.  + Fatigue, but she feels she is very functioning.  Denies shortness of breath.  Denies seeing any blood in the stool.  Denies family history of cancer.  INTERVAL HISTORY Linda Mcgee is a 68 y.o. female who has above history reviewed by me today presents to re-establish care for iron   deficiency anemia.  She reports feeling at baseline, +Fatigue  Denies hematochezia, hematuria, hematemesis, epistaxis, black tarry stool or easy bruising. Denies abdominal pain Weight is stable since last visit.  Review of Systems  Constitutional:  Positive for fatigue. Negative for appetite change, chills, fever and unexpected weight change.  HENT:   Negative for hearing loss and voice change.   Eyes:  Negative for eye problems.  Respiratory:  Negative for chest tightness and cough.   Cardiovascular:  Negative for chest pain.  Gastrointestinal:  Negative for abdominal distention, abdominal pain, blood in stool and nausea.  Endocrine: Negative for hot flashes.  Genitourinary:  Negative for difficulty urinating and frequency.   Musculoskeletal:  Negative for arthralgias.  Skin:  Negative for itching and rash.  Neurological:  Negative for extremity weakness.  Hematological:  Negative for adenopathy.  Psychiatric/Behavioral:  Negative for confusion.     MEDICAL HISTORY:  Past Medical History:  Diagnosis Date   Abdominal pain, epigastric    Alcohol -induced chronic pancreatitis (HCC)    Anemia    Asthma    Hepatic steatosis    History of pancreatitis    Hypertension    Hypokalemia    IDA (iron  deficiency anemia) 11/01/2021   Iron  deficiency anemia     SURGICAL HISTORY: Past Surgical History:  Procedure Laterality Date   COLONOSCOPY  06/2011   COLONOSCOPY WITH PROPOFOL  N/A 12/11/2021   Procedure: COLONOSCOPY WITH PROPOFOL ;  Surgeon: Shane Darling, MD;  Location: [facility] ENDOSCOPY;  Service: Endoscopy;  Laterality: N/A;   ESOPHAGOGASTRODUODENOSCOPY (EGD) WITH PROPOFOL  N/A 12/11/2021   Procedure: ESOPHAGOGASTRODUODENOSCOPY (EGD) WITH PROPOFOL ;  Surgeon: Shane Darling, MD;  Location: [facility] ENDOSCOPY;  Service: Endoscopy;  Laterality: N/A;   LAPAROSCOPIC OOPHERECTOMY Left     SOCIAL HISTORY: Social History   Socioeconomic History   Marital status: Widowed    Spouse  name: Not on file   Number of children: Not on file   Years of education: Not on file   Highest education level: Not on file  Occupational History   Not on file  Tobacco Use   Smoking status: Former   Smokeless tobacco: Never  Vaping Use   Vaping status: Never Used  Substance and Sexual Activity   Alcohol  use: Yes    Comment: 4 bottles of wine weekly   Drug use: Never   Sexual activity: Not Currently  Other Topics Concern   Not on file  Social History Narrative   Not on file   Social Drivers of Health   Financial Resource Strain: Low Risk  (02/18/2024)   Received from [facility]   Overall Financial Resource Strain (CARDIA)    Difficulty of Paying Living Expenses: Not hard at all  Food Insecurity: No Food Insecurity (02/18/2024)   Received from [facility]   Hunger Vital Sign    Within the past 12 months, you worried that your food would run out before you got the money to buy more.: Never true    Within the past 12 months, the food you bought just didn't last and you didn't have money to get more.: Never true  Transportation Needs: No Transportation Needs (02/18/2024)   Received from [facility] - Transportation    In the past 12 months, has lack of transportation kept you from medical appointments or from getting medications?: No    Lack of Transportation (Non-Medical): No  Physical Activity: Not on file  Stress: Not on file  Social Connections: Not on file  Intimate Partner Violence: Not on file    FAMILY HISTORY: Family History  Problem Relation Age of Onset   Hypertension Mother    Breast cancer Neg Hx     ALLERGIES:  is allergic to lisinopril-hydrochlorothiazide.  MEDICATIONS:  Current Outpatient Medications  Medication Sig Dispense Refill   ADVAIR DISKUS 250-50 MCG/DOSE AEPB Inhale 1 puff into the lungs every 12 (twelve) hours.     albuterol  (VENTOLIN  HFA) 108 (90 Base) MCG/ACT inhaler Inhale 2  puffs into the lungs every 4 (four) hours as needed for wheezing or shortness of breath.     losartan -hydrochlorothiazide (HYZAAR) 100-25 MG tablet Take 1 tablet by mouth daily.     omeprazole (PRILOSEC) 20 MG capsule Take by mouth.     triamcinolone (NASACORT) 55 MCG/ACT AERO nasal inhaler Place 2 sprays into the nose daily.     CREON 12000-38000 units CPEP capsule Take 12,000 Units by mouth 3 (three) times daily before meals.     ondansetron  (ZOFRAN -ODT) 4 MG disintegrating tablet Take 1 tablet (4 mg total) by mouth every 8 (eight) hours as needed for nausea or vomiting. (Patient not taking: Reported on 04/02/2024) 12 tablet 0   No current facility-administered medications for this visit.     PHYSICAL EXAMINATION: ECOG PERFORMANCE STATUS: 1 - Symptomatic but completely ambulatory Vitals:   04/02/24 1012 04/02/24 1039  BP: (!) 149/98 (!) 156/85  Pulse: 78   Resp: 20   Temp: 98.6 F (37 C)  SpO2: 100%     Filed Weights   04/02/24 1012  Weight: 113 lb 11.2 oz (51.6 kg)     Physical Exam Constitutional:      General: She is not in acute distress. HENT:     Head: Normocephalic and atraumatic.   Eyes:     General: No scleral icterus.   Cardiovascular:     Rate and Rhythm: Normal rate.  Pulmonary:     Effort: Pulmonary effort is normal. No respiratory distress.     Breath sounds: No wheezing.  Abdominal:     General: Bowel sounds are normal. There is no distension.     Palpations: Abdomen is soft.   Musculoskeletal:        General: No deformity. Normal range of motion.     Cervical back: Normal range of motion and neck supple.   Skin:    General: Skin is warm and dry.     Findings: No erythema or rash.   Neurological:     Mental Status: She is alert and oriented to person, place, and time. Mental status is at baseline.     Cranial Nerves: No cranial nerve deficit.   Psychiatric:        Mood and Affect: Mood normal.     LABORATORY DATA:  I have reviewed the  data as listed Lab Results  Component Value Date   WBC 3.8 (L) 03/31/2024   HGB 10.2 (L) 03/31/2024   HCT 31.9 (L) 03/31/2024   MCV 92.2 03/31/2024   PLT 314 03/31/2024   Recent Labs    05/29/23 1540 06/17/23 1452  NA 139  --   K 3.6  --   CL 108  --   CO2 24  --   GLUCOSE 112*  --   BUN 13  --   CREATININE 0.94  --   CALCIUM 10.5*  --   GFRNONAA >60  --   PROT 7.2 7.0  ALBUMIN 3.8 3.9  AST 13* 14*  ALT 9 13  ALKPHOS 55 50  BILITOT 0.6 0.2*  BILIDIR  --  <0.1  IBILI  --  NOT CALCULATED   Iron /TIBC/Ferritin/ %Sat    Component Value Date/Time   IRON  85 03/31/2024 1255   TIBC 449 03/31/2024 1255   FERRITIN 4 (L) 03/31/2024 1255   IRONPCTSAT 19 03/31/2024 1255      RADIOGRAPHIC STUDIES: I have personally reviewed the radiological images as listed and agreed with the findings in the report. No results found.

## 2024-04-02 NOTE — Assessment & Plan Note (Addendum)
#   severe iron  deficiency anemia. Lab Results  Component Value Date   HGB 10.2 (L) 03/31/2024   TIBC 449 03/31/2024   IRONPCTSAT 19 03/31/2024   FERRITIN 4 (L) 03/31/2024   Recommend IV venofer  weekly x 4 Suspect GI blood loss. Recommend patient to follow up with GI- Dr. Mamie Searles

## 2024-04-09 ENCOUNTER — Inpatient Hospital Stay

## 2024-04-09 ENCOUNTER — Other Ambulatory Visit: Payer: Self-pay | Admitting: Oncology

## 2024-04-09 VITALS — BP 182/78 | HR 69 | Temp 97.2°F

## 2024-04-09 DIAGNOSIS — D500 Iron deficiency anemia secondary to blood loss (chronic): Secondary | ICD-10-CM

## 2024-04-09 MED ORDER — IRON SUCROSE 20 MG/ML IV SOLN
200.0000 mg | Freq: Once | INTRAVENOUS | Status: DC
Start: 2024-04-09 — End: 2024-04-09

## 2024-04-09 MED ORDER — SODIUM CHLORIDE 0.9% FLUSH
10.0000 mL | Freq: Once | INTRAVENOUS | Status: DC | PRN
  Filled 2024-04-09: qty 10

## 2024-04-09 NOTE — Progress Notes (Signed)
 B/p elevated, pt denies s/s, pt reports she has been running around all day and that she did take her B/P medications.  Per Dr. Babara hold Venofer . Pt discharged and sent to scheduling, pt stable and understands plan.

## 2024-04-14 ENCOUNTER — Inpatient Hospital Stay: Attending: Oncology

## 2024-04-14 VITALS — BP 121/73 | HR 60 | Temp 97.8°F | Resp 17

## 2024-04-14 DIAGNOSIS — D509 Iron deficiency anemia, unspecified: Secondary | ICD-10-CM | POA: Diagnosis present

## 2024-04-14 DIAGNOSIS — D500 Iron deficiency anemia secondary to blood loss (chronic): Secondary | ICD-10-CM

## 2024-04-14 MED ORDER — SODIUM CHLORIDE 0.9% FLUSH
10.0000 mL | Freq: Once | INTRAVENOUS | Status: AC | PRN
  Administered 2024-04-14: 10 mL
  Filled 2024-04-14: qty 10

## 2024-04-14 MED ORDER — IRON SUCROSE 20 MG/ML IV SOLN
200.0000 mg | Freq: Once | INTRAVENOUS | Status: AC
  Administered 2024-04-14: 200 mg via INTRAVENOUS
  Filled 2024-04-14: qty 10

## 2024-04-14 NOTE — Patient Instructions (Signed)

## 2024-04-14 NOTE — Progress Notes (Signed)
Patient tolerated Venofer infusion well, no questions/concerns voiced. Monitored 30 min post transfusion. Patient stable at discharge. VSS. AVS given.

## 2024-04-21 ENCOUNTER — Inpatient Hospital Stay

## 2024-04-21 VITALS — BP 131/82 | HR 72 | Temp 96.1°F | Resp 17

## 2024-04-21 DIAGNOSIS — D509 Iron deficiency anemia, unspecified: Secondary | ICD-10-CM | POA: Diagnosis not present

## 2024-04-21 DIAGNOSIS — D500 Iron deficiency anemia secondary to blood loss (chronic): Secondary | ICD-10-CM

## 2024-04-21 MED ORDER — SODIUM CHLORIDE 0.9% FLUSH
10.0000 mL | Freq: Once | INTRAVENOUS | Status: AC | PRN
  Administered 2024-04-21: 10 mL
  Filled 2024-04-21: qty 10

## 2024-04-21 MED ORDER — IRON SUCROSE 20 MG/ML IV SOLN
200.0000 mg | Freq: Once | INTRAVENOUS | Status: AC
  Administered 2024-04-21: 200 mg via INTRAVENOUS
  Filled 2024-04-21: qty 10

## 2024-04-21 NOTE — Progress Notes (Signed)
Patient tolerated Venofer infusion well. Explained recommendation of 30 min post monitoring. Patient refused to wait post monitoring. Educated on what signs to watch for & to call with any concerns. No questions, discharged. Stable  

## 2024-04-28 ENCOUNTER — Inpatient Hospital Stay

## 2024-04-28 VITALS — BP 149/82 | HR 56 | Temp 97.0°F | Resp 18

## 2024-04-28 DIAGNOSIS — D509 Iron deficiency anemia, unspecified: Secondary | ICD-10-CM | POA: Diagnosis not present

## 2024-04-28 DIAGNOSIS — D500 Iron deficiency anemia secondary to blood loss (chronic): Secondary | ICD-10-CM

## 2024-04-28 MED ORDER — IRON SUCROSE 20 MG/ML IV SOLN
200.0000 mg | Freq: Once | INTRAVENOUS | Status: AC
  Administered 2024-04-28: 200 mg via INTRAVENOUS
  Filled 2024-04-28: qty 10

## 2024-04-28 NOTE — Patient Instructions (Signed)

## 2024-07-06 ENCOUNTER — Other Ambulatory Visit

## 2024-07-06 ENCOUNTER — Inpatient Hospital Stay: Attending: Oncology

## 2024-07-06 DIAGNOSIS — Z87891 Personal history of nicotine dependence: Secondary | ICD-10-CM | POA: Insufficient documentation

## 2024-07-06 DIAGNOSIS — K2970 Gastritis, unspecified, without bleeding: Secondary | ICD-10-CM | POA: Diagnosis not present

## 2024-07-06 DIAGNOSIS — D509 Iron deficiency anemia, unspecified: Secondary | ICD-10-CM | POA: Diagnosis present

## 2024-07-06 DIAGNOSIS — D500 Iron deficiency anemia secondary to blood loss (chronic): Secondary | ICD-10-CM

## 2024-07-06 LAB — CBC WITH DIFFERENTIAL (CANCER CENTER ONLY)
Abs Immature Granulocytes: 0.02 K/uL (ref 0.00–0.07)
Basophils Absolute: 0 K/uL (ref 0.0–0.1)
Basophils Relative: 0 %
Eosinophils Absolute: 0.1 K/uL (ref 0.0–0.5)
Eosinophils Relative: 1 %
HCT: 38.4 % (ref 36.0–46.0)
Hemoglobin: 12.3 g/dL (ref 12.0–15.0)
Immature Granulocytes: 0 %
Lymphocytes Relative: 20 %
Lymphs Abs: 0.9 K/uL (ref 0.7–4.0)
MCH: 31.4 pg (ref 26.0–34.0)
MCHC: 32 g/dL (ref 30.0–36.0)
MCV: 98 fL (ref 80.0–100.0)
Monocytes Absolute: 0.6 K/uL (ref 0.1–1.0)
Monocytes Relative: 14 %
Neutro Abs: 3 K/uL (ref 1.7–7.7)
Neutrophils Relative %: 65 %
Platelet Count: 268 K/uL (ref 150–400)
RBC: 3.92 MIL/uL (ref 3.87–5.11)
RDW: 14.9 % (ref 11.5–15.5)
WBC Count: 4.7 K/uL (ref 4.0–10.5)
nRBC: 0 % (ref 0.0–0.2)

## 2024-07-06 LAB — RETIC PANEL
Immature Retic Fract: 18.2 % — ABNORMAL HIGH (ref 2.3–15.9)
RBC.: 3.98 MIL/uL (ref 3.87–5.11)
Retic Count, Absolute: 86.8 K/uL (ref 19.0–186.0)
Retic Ct Pct: 2.2 % (ref 0.4–3.1)
Reticulocyte Hemoglobin: 34.6 pg (ref >27.9)

## 2024-07-06 LAB — IRON AND TIBC
Iron: 72 ug/dL (ref 28–170)
Saturation Ratios: 17 % (ref 10.4–31.8)
TIBC: 420 ug/dL (ref 250–450)
UIBC: 348 ug/dL

## 2024-07-06 LAB — FERRITIN: Ferritin: 26 ng/mL (ref 11–307)

## 2024-07-08 ENCOUNTER — Inpatient Hospital Stay

## 2024-07-08 ENCOUNTER — Inpatient Hospital Stay: Admitting: Oncology

## 2024-07-08 ENCOUNTER — Encounter: Payer: Self-pay | Admitting: Oncology

## 2024-07-08 VITALS — BP 164/88 | HR 65 | Temp 97.8°F | Resp 20 | Wt 126.9 lb

## 2024-07-08 DIAGNOSIS — K2950 Unspecified chronic gastritis without bleeding: Secondary | ICD-10-CM | POA: Diagnosis not present

## 2024-07-08 DIAGNOSIS — D509 Iron deficiency anemia, unspecified: Secondary | ICD-10-CM | POA: Diagnosis not present

## 2024-07-08 DIAGNOSIS — D500 Iron deficiency anemia secondary to blood loss (chronic): Secondary | ICD-10-CM

## 2024-07-08 NOTE — Assessment & Plan Note (Addendum)
#   severe iron  deficiency anemia. Lab Results  Component Value Date   HGB 12.3 07/06/2024   TIBC 420 07/06/2024   IRONPCTSAT 17 07/06/2024   FERRITIN 26 07/06/2024    Improved hemoglobin Iron  panel.  Hold off IV Venofer  treatments. Recommend patient follow-up with gastroenterology.

## 2024-07-08 NOTE — Progress Notes (Signed)
 Hematology/Oncology Progress note [phone] [phone]         Patient Care Team: Valora Agent, MD as PCP - General (Family Medicine) Moishe Suzen LABOR, NP (Inactive) as Nurse Practitioner (Gastroenterology) Babara Call, MD as Consulting Physician (Hematology and Oncology)  CHIEF COMPLAINTS/REASON FOR VISIT:  Follow-up for anemia  ASSESSMENT & PLAN:   IDA (iron  deficiency anemia) # severe iron  deficiency anemia. Lab Results  Component Value Date   HGB 12.3 07/06/2024   TIBC 420 07/06/2024   IRONPCTSAT 17 07/06/2024   FERRITIN 26 07/06/2024    Improved hemoglobin Iron  panel.  Hold off IV Venofer  treatments. Recommend patient follow-up with gastroenterology.  Gastritis Continue PPI, follow up with GI   Follow up in 4 months.   All questions were answered. The patient knows to call the clinic with any problems, questions or concerns.  Call Babara, MD, PhD [facility] Hematology Oncology 07/08/2024    HISTORY OF PRESENTING ILLNESS:   Linda Mcgee is a  68 y.o.  female with PMH listed below was seen in consultation at the request of  Valora Agent, MD  for evaluation of anemia 09/12/2021, patient had a CBC done.  Hemoglobin 8, MCV 94.5, white count 7.2, platelet count 569, 06/13/2021, iron  panel showed TIBC 492, iron  saturation 4, 08/24/2021, iron  panel showed TIBC 01/13/1929, iron  saturation 5.  Patient reports that she is chronic anemia.  She has been taking oral iron  supplementation for years.  Previously, she takes on daily basis and then she takes once every 1 to 2 days.  After she was told that her iron  levels were low in September/October 2022, she started back on taking iron  supplementation daily.  + Fatigue, but she feels she is very functioning.  Denies shortness of breath.  Denies seeing any blood in the stool.  Denies family history of cancer.  INTERVAL HISTORY Linda Mcgee is a 68 y.o. female who has above history reviewed by me  today presents to re-establish care for iron  deficiency anemia.  She reports feeling at baseline, +Fatigue  Denies hematochezia, hematuria, hematemesis, epistaxis, black tarry stool or easy bruising. Denies abdominal pain Weight is stable since last visit.  Review of Systems  Constitutional:  Positive for fatigue. Negative for appetite change, chills, fever and unexpected weight change.  HENT:   Negative for hearing loss and voice change.   Eyes:  Negative for eye problems.  Respiratory:  Negative for chest tightness and cough.   Cardiovascular:  Negative for chest pain.  Gastrointestinal:  Negative for abdominal distention, abdominal pain, blood in stool and nausea.  Endocrine: Negative for hot flashes.  Genitourinary:  Negative for difficulty urinating and frequency.   Musculoskeletal:  Negative for arthralgias.  Skin:  Negative for itching and rash.  Neurological:  Negative for extremity weakness.  Hematological:  Negative for adenopathy.  Psychiatric/Behavioral:  Negative for confusion.     MEDICAL HISTORY:  Past Medical History:  Diagnosis Date   Abdominal pain, epigastric    Alcohol -induced chronic pancreatitis (HCC)    Anemia    Asthma    Hepatic steatosis    History of pancreatitis    Hypertension    Hypokalemia    IDA (iron  deficiency anemia) 11/01/2021   Iron  deficiency anemia     SURGICAL HISTORY: Past Surgical History:  Procedure Laterality Date   COLONOSCOPY  06/2011   COLONOSCOPY WITH PROPOFOL  N/A 12/11/2021   Procedure: COLONOSCOPY WITH PROPOFOL ;  Surgeon: Maryruth Ole DASEN, MD;  Location: [facility] ENDOSCOPY;  Service: Endoscopy;  Laterality: N/A;   ESOPHAGOGASTRODUODENOSCOPY (EGD) WITH PROPOFOL  N/A 12/11/2021   Procedure: ESOPHAGOGASTRODUODENOSCOPY (EGD) WITH PROPOFOL ;  Surgeon: Maryruth Ole DASEN, MD;  Location: [facility] ENDOSCOPY;  Service: Endoscopy;  Laterality: N/A;   LAPAROSCOPIC OOPHERECTOMY Left     SOCIAL HISTORY: Social History   Socioeconomic  History   Marital status: Widowed    Spouse name: Not on file   Number of children: Not on file   Years of education: Not on file   Highest education level: Not on file  Occupational History   Not on file  Tobacco Use   Smoking status: Former   Smokeless tobacco: Never  Vaping Use   Vaping status: Never Used  Substance and Sexual Activity   Alcohol  use: Yes    Comment: 4 bottles of wine weekly   Drug use: Never   Sexual activity: Not Currently  Other Topics Concern   Not on file  Social History Narrative   Not on file   Social Drivers of Health   Financial Resource Strain: Low Risk  (02/18/2024)   Received from [facility]   Overall Financial Resource Strain (CARDIA)    Difficulty of Paying Living Expenses: Not hard at all  Food Insecurity: No Food Insecurity (02/18/2024)   Received from [facility]   Hunger Vital Sign    Within the past 12 months, you worried that your food would run out before you got the money to buy more.: Never true    Within the past 12 months, the food you bought just didn't last and you didn't have money to get more.: Never true  Transportation Needs: No Transportation Needs (02/18/2024)   Received from [facility] - Transportation    In the past 12 months, has lack of transportation kept you from medical appointments or from getting medications?: No    Lack of Transportation (Non-Medical): No  Physical Activity: Not on file  Stress: Not on file  Social Connections: Not on file  Intimate Partner Violence: Not on file    FAMILY HISTORY: Family History  Problem Relation Age of Onset   Hypertension Mother    Breast cancer Neg Hx     ALLERGIES:  is allergic to lisinopril-hydrochlorothiazide.  MEDICATIONS:  Current Outpatient Medications  Medication Sig Dispense Refill   ADVAIR DISKUS 250-50 MCG/DOSE AEPB Inhale 1 puff into the lungs every 12 (twelve) hours.     albuterol  (VENTOLIN   HFA) 108 (90 Base) MCG/ACT inhaler Inhale 2 puffs into the lungs every 4 (four) hours as needed for wheezing or shortness of breath.     losartan -hydrochlorothiazide (HYZAAR) 100-25 MG tablet Take 1 tablet by mouth daily.     triamcinolone (NASACORT) 55 MCG/ACT AERO nasal inhaler Place 2 sprays into the nose daily.     CREON 12000-38000 units CPEP capsule Take 12,000 Units by mouth 3 (three) times daily before meals.     omeprazole (PRILOSEC) 20 MG capsule Take by mouth.     ondansetron  (ZOFRAN -ODT) 4 MG disintegrating tablet Take 1 tablet (4 mg total) by mouth every 8 (eight) hours as needed for nausea or vomiting. (Patient not taking: Reported on 04/02/2024) 12 tablet 0   No current facility-administered medications for this visit.     PHYSICAL EXAMINATION: ECOG PERFORMANCE STATUS: 1 - Symptomatic but completely ambulatory Vitals:   07/08/24 1356  BP: (!) 164/88  Pulse: 65  Resp: 20  Temp: 97.8 F (36.6 C)  SpO2: 100%  Filed Weights   07/08/24 1356  Weight: 126 lb 14.4 oz (57.6 kg)     Physical Exam Constitutional:      General: She is not in acute distress. HENT:     Head: Normocephalic and atraumatic.  Eyes:     General: No scleral icterus. Cardiovascular:     Rate and Rhythm: Normal rate.  Pulmonary:     Effort: Pulmonary effort is normal. No respiratory distress.     Breath sounds: No wheezing.  Abdominal:     General: Bowel sounds are normal. There is no distension.     Palpations: Abdomen is soft.  Musculoskeletal:        General: No deformity. Normal range of motion.     Cervical back: Normal range of motion and neck supple.  Skin:    General: Skin is warm and dry.     Findings: No erythema or rash.  Neurological:     Mental Status: She is alert and oriented to person, place, and time. Mental status is at baseline.     Cranial Nerves: No cranial nerve deficit.  Psychiatric:        Mood and Affect: Mood normal.     LABORATORY DATA:  I have reviewed  the data as listed Lab Results  Component Value Date   WBC 4.7 07/06/2024   HGB 12.3 07/06/2024   HCT 38.4 07/06/2024   MCV 98.0 07/06/2024   PLT 268 07/06/2024   No results for input(s): NA, K, CL, CO2, GLUCOSE, BUN, CREATININE, CALCIUM, GFRNONAA, GFRAA, PROT, ALBUMIN, AST, ALT, ALKPHOS, BILITOT, BILIDIR, IBILI in the last 8760 hours.  Iron /TIBC/Ferritin/ %Sat    Component Value Date/Time   IRON  72 07/06/2024 1456   TIBC 420 07/06/2024 1456   FERRITIN 26 07/06/2024 1456   IRONPCTSAT 17 07/06/2024 1456      RADIOGRAPHIC STUDIES: I have personally reviewed the radiological images as listed and agreed with the findings in the report. No results found.

## 2024-07-08 NOTE — Assessment & Plan Note (Signed)
Continue PPI, follow-up with GI

## 2024-11-04 ENCOUNTER — Inpatient Hospital Stay

## 2024-11-04 ENCOUNTER — Inpatient Hospital Stay: Attending: Oncology

## 2024-11-04 DIAGNOSIS — K2970 Gastritis, unspecified, without bleeding: Secondary | ICD-10-CM | POA: Insufficient documentation

## 2024-11-04 DIAGNOSIS — K2950 Unspecified chronic gastritis without bleeding: Secondary | ICD-10-CM

## 2024-11-04 DIAGNOSIS — Z87891 Personal history of nicotine dependence: Secondary | ICD-10-CM | POA: Insufficient documentation

## 2024-11-04 DIAGNOSIS — D500 Iron deficiency anemia secondary to blood loss (chronic): Secondary | ICD-10-CM

## 2024-11-04 DIAGNOSIS — D509 Iron deficiency anemia, unspecified: Secondary | ICD-10-CM | POA: Diagnosis present

## 2024-11-04 LAB — IRON AND TIBC
Iron: 27 ug/dL — ABNORMAL LOW (ref 28–170)
Saturation Ratios: 6 % — ABNORMAL LOW (ref 10.4–31.8)
TIBC: 482 ug/dL — ABNORMAL HIGH (ref 250–450)
UIBC: 455 ug/dL

## 2024-11-04 LAB — RETIC PANEL
Immature Retic Fract: 28.9 % — ABNORMAL HIGH (ref 2.3–15.9)
RBC.: 3.28 MIL/uL — ABNORMAL LOW (ref 3.87–5.11)
Retic Count, Absolute: 92.8 K/uL (ref 19.0–186.0)
Retic Ct Pct: 2.8 % (ref 0.4–3.1)
Reticulocyte Hemoglobin: 26.1 pg — ABNORMAL LOW

## 2024-11-04 LAB — CBC (CANCER CENTER ONLY)
HCT: 30.6 % — ABNORMAL LOW (ref 36.0–46.0)
Hemoglobin: 9.4 g/dL — ABNORMAL LOW (ref 12.0–15.0)
MCH: 28.7 pg (ref 26.0–34.0)
MCHC: 30.7 g/dL (ref 30.0–36.0)
MCV: 93.3 fL (ref 80.0–100.0)
Platelet Count: 359 K/uL (ref 150–400)
RBC: 3.28 MIL/uL — ABNORMAL LOW (ref 3.87–5.11)
RDW: 14.6 % (ref 11.5–15.5)
WBC Count: 4.7 K/uL (ref 4.0–10.5)
nRBC: 0 % (ref 0.0–0.2)

## 2024-11-04 LAB — FERRITIN: Ferritin: 14 ng/mL (ref 11–307)

## 2024-11-11 ENCOUNTER — Inpatient Hospital Stay: Admitting: Oncology

## 2024-11-11 ENCOUNTER — Inpatient Hospital Stay

## 2024-11-11 ENCOUNTER — Encounter: Payer: Self-pay | Admitting: Oncology

## 2024-11-11 VITALS — BP 160/90 | HR 63

## 2024-11-11 VITALS — BP 153/72 | HR 72 | Temp 97.0°F | Resp 18 | Wt 130.6 lb

## 2024-11-11 DIAGNOSIS — D500 Iron deficiency anemia secondary to blood loss (chronic): Secondary | ICD-10-CM

## 2024-11-11 DIAGNOSIS — K2950 Unspecified chronic gastritis without bleeding: Secondary | ICD-10-CM | POA: Diagnosis not present

## 2024-11-11 DIAGNOSIS — D509 Iron deficiency anemia, unspecified: Secondary | ICD-10-CM | POA: Diagnosis not present

## 2024-11-11 MED ORDER — IRON SUCROSE 20 MG/ML IV SOLN
200.0000 mg | Freq: Once | INTRAVENOUS | Status: AC
  Administered 2024-11-11: 200 mg via INTRAVENOUS
  Filled 2024-11-11: qty 10

## 2024-11-11 NOTE — Assessment & Plan Note (Signed)
"  Continue PPI, follow up with GI  "

## 2024-11-11 NOTE — Assessment & Plan Note (Addendum)
#   severe iron  deficiency anemia. Lab Results  Component Value Date   HGB 9.4 (L) 11/04/2024   TIBC 482 (H) 11/04/2024   IRONPCTSAT 6 (L) 11/04/2024   FERRITIN 14 11/04/2024   Hemoglobin dropped again. Recommend IV venofer  weekly x 4. Recommend patient follow-up with gastroenterology.

## 2024-11-11 NOTE — Progress Notes (Signed)
 " Hematology/Oncology Progress note [phone] [phone]         Patient Care Team: Valora Lynwood FALCON, MD as PCP - General (Family Medicine) Moishe Suzen LABOR, NP (Inactive) as Nurse Practitioner (Gastroenterology) Babara Call, MD as Consulting Physician (Oncology)  CHIEF COMPLAINTS/REASON FOR VISIT:  Follow-up for anemia  ASSESSMENT & PLAN:   IDA (iron  deficiency anemia) # severe iron  deficiency anemia. Lab Results  Component Value Date   HGB 9.4 (L) 11/04/2024   TIBC 482 (H) 11/04/2024   IRONPCTSAT 6 (L) 11/04/2024   FERRITIN 14 11/04/2024   Hemoglobin dropped again. Recommend IV venofer  weekly x 4. Recommend patient follow-up with gastroenterology.  Gastritis Continue PPI, follow up with GI   Follow up in 4 months.   All questions were answered. The patient knows to call the clinic with any problems, questions or concerns.  Call Babara, MD, PhD [facility] Hematology Oncology 11/11/2024    HISTORY OF PRESENTING ILLNESS:   Linda Mcgee is a  69 y.o.  female with PMH listed below was seen in consultation at the request of  Valora Lynwood FALCON, MD  for evaluation of anemia 09/12/2021, patient had a CBC done.  Hemoglobin 8, MCV 94.5, white count 7.2, platelet count 569, 06/13/2021, iron  panel showed TIBC 492, iron  saturation 4, 08/24/2021, iron  panel showed TIBC 01/13/1929, iron  saturation 5.  Patient reports that she is chronic anemia.  She has been taking oral iron  supplementation for years.  Previously, she takes on daily basis and then she takes once every 1 to 2 days.  After she was told that her iron  levels were low in September/October 2022, she started back on taking iron  supplementation daily.  + Fatigue, but she feels she is very functioning.  Denies shortness of breath.  Denies seeing any blood in the stool.  Denies family history of cancer.  INTERVAL HISTORY Linda Mcgee is a 69 y.o. female who has above history reviewed by me today  presents to re-establish care for iron  deficiency anemia.  She reports feeling at baseline, +Fatigue  Denies hematochezia, hematuria, hematemesis, epistaxis, black tarry stool or easy bruising. Denies abdominal pain Weight is stable since last visit.  Review of Systems  Constitutional:  Positive for fatigue. Negative for appetite change, chills, fever and unexpected weight change.  HENT:   Negative for hearing loss and voice change.   Eyes:  Negative for eye problems.  Respiratory:  Negative for chest tightness and cough.   Cardiovascular:  Negative for chest pain.  Gastrointestinal:  Negative for abdominal distention, abdominal pain, blood in stool and nausea.  Endocrine: Negative for hot flashes.  Genitourinary:  Negative for difficulty urinating and frequency.   Musculoskeletal:  Negative for arthralgias.  Skin:  Negative for itching and rash.  Neurological:  Negative for extremity weakness.  Hematological:  Negative for adenopathy.  Psychiatric/Behavioral:  Negative for confusion.     MEDICAL HISTORY:  Past Medical History:  Diagnosis Date   Abdominal pain, epigastric    Alcohol -induced chronic pancreatitis (HCC)    Anemia    Asthma    Hepatic steatosis    History of pancreatitis    Hypertension    Hypokalemia    IDA (iron  deficiency anemia) 11/01/2021   Iron  deficiency anemia     SURGICAL HISTORY: Past Surgical History:  Procedure Laterality Date   COLONOSCOPY  06/2011   COLONOSCOPY WITH PROPOFOL  N/A 12/11/2021   Procedure: COLONOSCOPY WITH PROPOFOL ;  Surgeon: Maryruth Ole DASEN, MD;  Location: [facility]  ENDOSCOPY;  Service: Endoscopy;  Laterality: N/A;   ESOPHAGOGASTRODUODENOSCOPY (EGD) WITH PROPOFOL  N/A 12/11/2021   Procedure: ESOPHAGOGASTRODUODENOSCOPY (EGD) WITH PROPOFOL ;  Surgeon: Maryruth Ole DASEN, MD;  Location: [facility] ENDOSCOPY;  Service: Endoscopy;  Laterality: N/A;   LAPAROSCOPIC OOPHERECTOMY Left     SOCIAL HISTORY: Social History   Socioeconomic History    Marital status: Widowed    Spouse name: Not on file   Number of children: Not on file   Years of education: Not on file   Highest education level: Not on file  Occupational History   Not on file  Tobacco Use   Smoking status: Former   Smokeless tobacco: Never  Vaping Use   Vaping status: Never Used  Substance and Sexual Activity   Alcohol  use: Yes    Comment: 4 bottles of wine weekly   Drug use: Never   Sexual activity: Not Currently  Other Topics Concern   Not on file  Social History Narrative   Not on file   Social Drivers of Health   Tobacco Use: Medium Risk (11/11/2024)   Patient History    Smoking Tobacco Use: Former    Smokeless Tobacco Use: Never    Passive Exposure: Not on file  Financial Resource Strain: Low Risk  (10/29/2024)   Received from [facility]   Overall Financial Resource Strain (CARDIA)    Difficulty of Paying Living Expenses: Not hard at all  Food Insecurity: No Food Insecurity (10/29/2024)   Received from [facility]   Epic    Within the past 12 months, you worried that your food would run out before you got the money to buy more.: Never true    Within the past 12 months, the food you bought just didn't last and you didn't have money to get more.: Never true  Transportation Needs: No Transportation Needs (10/29/2024)   Received from [facility] - Transportation    In the past 12 months, has lack of transportation kept you from medical appointments or from getting medications?: No    Lack of Transportation (Non-Medical): No  Physical Activity: Not on file  Stress: Not on file  Social Connections: Not on file  Intimate Partner Violence: Not on file  Depression (EYV7-0): Not on file  Alcohol  Screen: Not on file  Housing: Low Risk  (10/29/2024)   Received from [facility]   Epic    In the last 12 months, was there a time when you were not able to pay the mortgage or rent  on time?: No    In the past 12 months, how many times have you moved where you were living?: 0    At any time in the past 12 months, were you homeless or living in a shelter (including now)?: No  Utilities: Not At Risk (10/29/2024)   Received from [facility]   Epic    In the past 12 months has the electric, gas, oil, or water company threatened to shut off services in your home?: No  Health Literacy: Not on file    FAMILY HISTORY: Family History  Problem Relation Age of Onset   Hypertension Mother    Breast cancer Neg Hx     ALLERGIES:  is allergic to lisinopril-hydrochlorothiazide.  MEDICATIONS:  Current Outpatient Medications  Medication Sig Dispense Refill   ADVAIR DISKUS 250-50 MCG/DOSE AEPB Inhale 1 puff into the lungs every 12 (twelve) hours.     albuterol  (  VENTOLIN  HFA) 108 (90 Base) MCG/ACT inhaler Inhale 2 puffs into the lungs every 4 (four) hours as needed for wheezing or shortness of breath.     losartan -hydrochlorothiazide (HYZAAR) 100-25 MG tablet Take 1 tablet by mouth daily.     triamcinolone (NASACORT) 55 MCG/ACT AERO nasal inhaler Place 2 sprays into the nose daily.     No current facility-administered medications for this visit.     PHYSICAL EXAMINATION: ECOG PERFORMANCE STATUS: 1 - Symptomatic but completely ambulatory Vitals:   11/11/24 1345 11/11/24 1357  BP: (!) 155/73 (!) 153/72  Pulse: 72   Resp: 18   Temp: (!) 97 F (36.1 C)   SpO2: 100%     Filed Weights   11/11/24 1345  Weight: 130 lb 9.6 oz (59.2 kg)     Physical Exam Constitutional:      General: She is not in acute distress. HENT:     Head: Normocephalic and atraumatic.  Eyes:     General: No scleral icterus. Cardiovascular:     Rate and Rhythm: Normal rate.  Pulmonary:     Effort: Pulmonary effort is normal. No respiratory distress.     Breath sounds: No wheezing.  Abdominal:     General: Bowel sounds are normal. There is no distension.     Palpations:  Abdomen is soft.  Musculoskeletal:        General: No deformity. Normal range of motion.     Cervical back: Normal range of motion and neck supple.  Skin:    General: Skin is warm and dry.     Findings: No erythema or rash.  Neurological:     Mental Status: She is alert and oriented to person, place, and time. Mental status is at baseline.     Cranial Nerves: No cranial nerve deficit.  Psychiatric:        Mood and Affect: Mood normal.     LABORATORY DATA:  I have reviewed the data as listed Lab Results  Component Value Date   WBC 4.7 11/04/2024   HGB 9.4 (L) 11/04/2024   HCT 30.6 (L) 11/04/2024   MCV 93.3 11/04/2024   PLT 359 11/04/2024   No results for input(s): NA, K, CL, CO2, GLUCOSE, BUN, CREATININE, CALCIUM, GFRNONAA, GFRAA, PROT, ALBUMIN, AST, ALT, ALKPHOS, BILITOT, BILIDIR, IBILI in the last 8760 hours.  Iron /TIBC/Ferritin/ %Sat    Component Value Date/Time   IRON  27 (L) 11/04/2024 0756   TIBC 482 (H) 11/04/2024 0756   FERRITIN 14 11/04/2024 0756   IRONPCTSAT 6 (L) 11/04/2024 0756      RADIOGRAPHIC STUDIES: I have personally reviewed the radiological images as listed and agreed with the findings in the report. No results found.   "

## 2024-11-11 NOTE — Patient Instructions (Signed)

## 2024-11-11 NOTE — Progress Notes (Signed)
 Declined post-observation. Aware of risks. Vitals stable at discharge.

## 2024-11-15 ENCOUNTER — Telehealth: Payer: Self-pay | Admitting: Oncology

## 2024-11-16 ENCOUNTER — Inpatient Hospital Stay

## 2024-11-24 ENCOUNTER — Inpatient Hospital Stay

## 2024-11-29 ENCOUNTER — Inpatient Hospital Stay

## 2024-12-06 ENCOUNTER — Inpatient Hospital Stay

## 2025-03-11 ENCOUNTER — Inpatient Hospital Stay

## 2025-03-17 ENCOUNTER — Inpatient Hospital Stay: Admitting: Oncology

## 2025-03-17 ENCOUNTER — Inpatient Hospital Stay
